# Patient Record
Sex: Male | Born: 1940 | Race: White | Hispanic: No | Smoking: Former smoker
Health system: Southern US, Community
[De-identification: ages and names within clinical notes are randomized; demographics above are authoritative.]

## PROBLEM LIST (undated history)

## (undated) DIAGNOSIS — I509 Heart failure, unspecified: Secondary | ICD-10-CM

## (undated) DIAGNOSIS — I4891 Unspecified atrial fibrillation: Secondary | ICD-10-CM

## (undated) DIAGNOSIS — R0600 Dyspnea, unspecified: Secondary | ICD-10-CM

## (undated) DIAGNOSIS — E119 Type 2 diabetes mellitus without complications: Secondary | ICD-10-CM

## (undated) DIAGNOSIS — M1990 Unspecified osteoarthritis, unspecified site: Secondary | ICD-10-CM

## (undated) DIAGNOSIS — G4730 Sleep apnea, unspecified: Secondary | ICD-10-CM

## (undated) DIAGNOSIS — K219 Gastro-esophageal reflux disease without esophagitis: Secondary | ICD-10-CM

## (undated) DIAGNOSIS — I2510 Atherosclerotic heart disease of native coronary artery without angina pectoris: Secondary | ICD-10-CM

## (undated) DIAGNOSIS — K279 Peptic ulcer, site unspecified, unspecified as acute or chronic, without hemorrhage or perforation: Secondary | ICD-10-CM

## (undated) DIAGNOSIS — I219 Acute myocardial infarction, unspecified: Secondary | ICD-10-CM

## (undated) DIAGNOSIS — I10 Essential (primary) hypertension: Secondary | ICD-10-CM

## (undated) DIAGNOSIS — E785 Hyperlipidemia, unspecified: Secondary | ICD-10-CM

## (undated) HISTORY — PX: HERNIA REPAIR: SHX51

## (undated) HISTORY — PX: COLONOSCOPY: SHX5424

## (undated) HISTORY — PX: FRACTURE SURGERY: SHX138

## (undated) HISTORY — PX: CARDIAC CATHETERIZATION: SHX172

---

## 2012-03-22 LAB — URINALYSIS, COMPLETE
Bacteria: NONE SEEN
Glucose,UR: NEGATIVE mg/dL (ref 0–75)
Nitrite: NEGATIVE
Ph: 5 (ref 4.5–8.0)
Protein: NEGATIVE
Specific Gravity: 1.014 (ref 1.003–1.030)

## 2012-03-22 LAB — COMPREHENSIVE METABOLIC PANEL
Albumin: 4.2 g/dL (ref 3.4–5.0)
Alkaline Phosphatase: 61 U/L (ref 50–136)
BUN: 21 mg/dL — ABNORMAL HIGH (ref 7–18)
Calcium, Total: 9.6 mg/dL (ref 8.5–10.1)
Co2: 25 mmol/L (ref 21–32)
Creatinine: 1.38 mg/dL — ABNORMAL HIGH (ref 0.60–1.30)
Glucose: 113 mg/dL — ABNORMAL HIGH (ref 65–99)
Osmolality: 285 (ref 275–301)
SGOT(AST): 32 U/L (ref 15–37)
Sodium: 141 mmol/L (ref 136–145)

## 2012-03-22 LAB — CBC
HCT: 42.2 % (ref 40.0–52.0)
HGB: 14 g/dL (ref 13.0–18.0)
Platelet: 190 10*3/uL (ref 150–440)
RBC: 4.72 10*6/uL (ref 4.40–5.90)
RDW: 14.8 % — ABNORMAL HIGH (ref 11.5–14.5)

## 2012-03-22 LAB — TSH: Thyroid Stimulating Horm: 0.724 u[IU]/mL

## 2012-03-22 LAB — CK TOTAL AND CKMB (NOT AT ARMC): CK-MB: 26.9 ng/mL — ABNORMAL HIGH (ref 0.5–3.6)

## 2012-03-22 LAB — LIPASE, BLOOD: Lipase: 172 U/L (ref 73–393)

## 2012-03-23 ENCOUNTER — Inpatient Hospital Stay: Payer: Self-pay | Admitting: Internal Medicine

## 2012-03-23 LAB — TROPONIN I: Troponin-I: 8.13 ng/mL — ABNORMAL HIGH

## 2012-03-23 LAB — LIPID PANEL
Cholesterol: 195 mg/dL
HDL Cholesterol: 37 mg/dL — ABNORMAL LOW
Ldl Cholesterol, Calc: 131 mg/dL — ABNORMAL HIGH
Triglycerides: 137 mg/dL
VLDL Cholesterol, Calc: 27 mg/dL

## 2012-03-23 LAB — APTT
Activated PTT: 27.9 secs (ref 23.6–35.9)
Activated PTT: 90.2 secs — ABNORMAL HIGH (ref 23.6–35.9)

## 2012-03-24 ENCOUNTER — Encounter (HOSPITAL_COMMUNITY): Payer: Self-pay | Admitting: Physician Assistant

## 2012-03-24 ENCOUNTER — Inpatient Hospital Stay (HOSPITAL_COMMUNITY)
Admission: AD | Admit: 2012-03-24 | Discharge: 2012-03-31 | DRG: 236 | Disposition: A | Discharge status: Home / Self Care | Payer: Medicare Other | Source: Other Acute Inpatient Hospital | Attending: Cardiothoracic Surgery | Admitting: Cardiothoracic Surgery

## 2012-03-24 DIAGNOSIS — K5900 Constipation, unspecified: Secondary | ICD-10-CM | POA: Diagnosis not present

## 2012-03-24 DIAGNOSIS — I4891 Unspecified atrial fibrillation: Secondary | ICD-10-CM | POA: Diagnosis not present

## 2012-03-24 DIAGNOSIS — E785 Hyperlipidemia, unspecified: Secondary | ICD-10-CM | POA: Diagnosis present

## 2012-03-24 DIAGNOSIS — I2510 Atherosclerotic heart disease of native coronary artery without angina pectoris: Secondary | ICD-10-CM

## 2012-03-24 DIAGNOSIS — I252 Old myocardial infarction: Secondary | ICD-10-CM

## 2012-03-24 DIAGNOSIS — D62 Acute posthemorrhagic anemia: Secondary | ICD-10-CM | POA: Diagnosis not present

## 2012-03-24 DIAGNOSIS — Z23 Encounter for immunization: Secondary | ICD-10-CM

## 2012-03-24 DIAGNOSIS — D696 Thrombocytopenia, unspecified: Secondary | ICD-10-CM | POA: Diagnosis not present

## 2012-03-24 DIAGNOSIS — Z951 Presence of aortocoronary bypass graft: Secondary | ICD-10-CM

## 2012-03-24 DIAGNOSIS — Z79899 Other long term (current) drug therapy: Secondary | ICD-10-CM

## 2012-03-24 DIAGNOSIS — K219 Gastro-esophageal reflux disease without esophagitis: Secondary | ICD-10-CM

## 2012-03-24 DIAGNOSIS — E8779 Other fluid overload: Secondary | ICD-10-CM | POA: Diagnosis not present

## 2012-03-24 DIAGNOSIS — Z8249 Family history of ischemic heart disease and other diseases of the circulatory system: Secondary | ICD-10-CM

## 2012-03-24 DIAGNOSIS — Z9861 Coronary angioplasty status: Secondary | ICD-10-CM

## 2012-03-24 DIAGNOSIS — I714 Abdominal aortic aneurysm, without rupture, unspecified: Secondary | ICD-10-CM

## 2012-03-24 DIAGNOSIS — Z01810 Encounter for preprocedural cardiovascular examination: Secondary | ICD-10-CM

## 2012-03-24 DIAGNOSIS — Z95820 Peripheral vascular angioplasty status with implants and grafts: Secondary | ICD-10-CM

## 2012-03-24 DIAGNOSIS — I10 Essential (primary) hypertension: Secondary | ICD-10-CM

## 2012-03-24 DIAGNOSIS — Z87891 Personal history of nicotine dependence: Secondary | ICD-10-CM

## 2012-03-24 DIAGNOSIS — I214 Non-ST elevation (NSTEMI) myocardial infarction: Secondary | ICD-10-CM

## 2012-03-24 HISTORY — DX: Acute myocardial infarction, unspecified: I21.9

## 2012-03-24 HISTORY — DX: Hyperlipidemia, unspecified: E78.5

## 2012-03-24 HISTORY — DX: Gastro-esophageal reflux disease without esophagitis: K21.9

## 2012-03-24 HISTORY — DX: Essential (primary) hypertension: I10

## 2012-03-24 LAB — TYPE AND SCREEN
ABO/RH(D): A POS
Antibody Screen: NEGATIVE

## 2012-03-24 LAB — CBC WITH DIFFERENTIAL/PLATELET
Basophil %: 0.8 %
Eosinophil #: 0.4 10*3/uL (ref 0.0–0.7)
Eosinophil %: 4.9 %
HCT: 40.3 % (ref 40.0–52.0)
MCH: 29.9 pg (ref 26.0–34.0)
MCHC: 33.2 g/dL (ref 32.0–36.0)
Neutrophil #: 4.8 10*3/uL (ref 1.4–6.5)

## 2012-03-24 LAB — COMPREHENSIVE METABOLIC PANEL
Albumin: 3.3 g/dL — ABNORMAL LOW (ref 3.4–5.0)
Alkaline Phosphatase: 50 U/L (ref 50–136)
Anion Gap: 8 (ref 7–16)
BUN: 13 mg/dL (ref 7–18)
Calcium, Total: 8.7 mg/dL (ref 8.5–10.1)
Co2: 25 mmol/L (ref 21–32)
Creatinine: 1.21 mg/dL (ref 0.60–1.30)
EGFR (African American): >60
Glucose: 103 mg/dL — ABNORMAL HIGH (ref 65–99)
Osmolality: 282 (ref 275–301)
Potassium: 4 mmol/L (ref 3.5–5.1)
SGOT(AST): 54 U/L — ABNORMAL HIGH (ref 15–37)
Total Protein: 6.7 g/dL (ref 6.4–8.2)

## 2012-03-24 LAB — ABO/RH: ABO/RH(D): A POS

## 2012-03-24 LAB — HEPARIN LEVEL (UNFRACTIONATED): Heparin Unfractionated: 0.62 IU/mL (ref 0.30–0.70)

## 2012-03-24 LAB — MAGNESIUM: Magnesium: 1.7 mg/dL — ABNORMAL LOW

## 2012-03-24 LAB — APTT: Activated PTT: 116.2 secs — ABNORMAL HIGH (ref 23.6–35.9)

## 2012-03-24 MED ORDER — ATORVASTATIN CALCIUM 40 MG PO TABS
40.0000 mg | ORAL_TABLET | Freq: Every day | ORAL | Status: DC
  Administered 2012-03-24 – 2012-03-25 (×2): 40 mg via ORAL
  Filled 2012-03-24 (×3): qty 1

## 2012-03-24 MED ORDER — TEMAZEPAM 15 MG PO CAPS: 15.0000 mg | ORAL_CAPSULE | Freq: Once | ORAL | Status: AC | PRN

## 2012-03-24 MED ORDER — SODIUM CHLORIDE 0.45 % IV SOLN: INTRAVENOUS | Status: DC

## 2012-03-24 MED ORDER — METOPROLOL TARTRATE 12.5 MG HALF TABLET
12.5000 mg | ORAL_TABLET | Freq: Two times a day (BID) | ORAL | Status: DC
  Administered 2012-03-24 – 2012-03-25 (×4): 12.5 mg via ORAL
  Filled 2012-03-24 (×6): qty 1

## 2012-03-24 MED ORDER — BISACODYL 5 MG PO TBEC: 5.0000 mg | DELAYED_RELEASE_TABLET | Freq: Once | ORAL | Status: DC

## 2012-03-24 MED ORDER — BISACODYL 5 MG PO TBEC
5.0000 mg | DELAYED_RELEASE_TABLET | Freq: Once | ORAL | Status: AC
  Administered 2012-03-24: 5 mg via ORAL
  Filled 2012-03-24: qty 1

## 2012-03-24 MED ORDER — ASPIRIN 81 MG PO CHEW
81.0000 mg | CHEWABLE_TABLET | Freq: Every day | ORAL | Status: DC
  Administered 2012-03-24 – 2012-03-25 (×2): 81 mg via ORAL
  Filled 2012-03-24: qty 1

## 2012-03-24 MED ORDER — MORPHINE SULFATE 2 MG/ML IJ SOLN
2.0000 mg | INTRAMUSCULAR | Status: DC | PRN
  Administered 2012-03-24: 2 mg via INTRAVENOUS
  Filled 2012-03-24: qty 1

## 2012-03-24 MED ORDER — CHLORHEXIDINE GLUCONATE 4 % EX LIQD
60.0000 mL | Freq: Once | CUTANEOUS | Status: AC
  Administered 2012-03-25: 4 via TOPICAL
  Filled 2012-03-24: qty 60

## 2012-03-24 MED ORDER — HEPARIN (PORCINE) IN NACL 100-0.45 UNIT/ML-% IJ SOLN
1340.0000 [IU]/h | INTRAMUSCULAR | Status: DC
  Administered 2012-03-24 – 2012-03-25 (×2): 1350 [IU]/h via INTRAVENOUS
  Filled 2012-03-24 (×4): qty 250

## 2012-03-24 MED ORDER — METOPROLOL TARTRATE 12.5 MG HALF TABLET
12.5000 mg | ORAL_TABLET | Freq: Once | ORAL | Status: AC
  Administered 2012-03-26: 12.5 mg via ORAL
  Filled 2012-03-24 (×2): qty 1

## 2012-03-24 MED ORDER — CHLORHEXIDINE GLUCONATE 4 % EX LIQD
60.0000 mL | Freq: Once | CUTANEOUS | Status: AC
  Administered 2012-03-26: 4 via TOPICAL
  Filled 2012-03-24: qty 60

## 2012-03-24 NOTE — Progress Notes (Signed)
Pharmacy: Heparin  Heparin level of 0.62 is therapeutic (goal 0.3-0.7).  Plan: 1) Continue heparin at 1340 units/hr 2) Follow up heparin level and CBC in AM

## 2012-03-24 NOTE — Consult Note (Signed)
ANTICOAGULATION CONSULT NOTE - Initial Consult  Pharmacy Consult for Heparin Indication: multivessel CAD  No Known Allergies  Patient Measurements: Height: 5\' 10"  (177.8 cm) Weight: 199 lb 1.2 oz (90.3 kg) IBW/kg (Calculated) : 73   Vital Signs: Temp: 97.7 F (36.5 C) (06/22 0934) Temp src: Oral (06/22 0934) BP: 148/76 mmHg (06/22 0934) Pulse Rate: 75  (06/22 0934)  Labs: No results found for this basename: HGB:2,HCT:3,PLT:3,APTT:3,LABPROT:3,INR:3,HEPARINUNFRC:3,CREATININE:3,CKTOTAL:3,CKMB:3,TROPONINI:3 in the last 72 hours  CrCl is unknown because no creatinine reading has been taken.   Medical History: Past Medical History  Diagnosis Date  . GERD (gastroesophageal reflux disease)   . Hyperlipidemia   . Hypertension   . Myocardial infarction    Medications:  Heparin @ 1340 units/hr  Assessment: 70yom transferred from [place] to [facility] after cath revealed multivessel CAD. He is awaiting CABG on Monday. Heparin drip was started at [place] yesterday around 1100 at 1340 units/hr (~15units/kg/hr). Cannot find documentation that a bolus was given. 6 hour APTT was ordered and came back a little high at 90.2 (goal 50-85 corresponding to heparin level 0.3-0.7). Rate was not adjusted. Heparin is currently running at 1340 units/hr. Baseline labs from [place]: sCr 1.2 and plts 190.  Goal of Therapy:  Heparin level 0.3-0.7 units/ml Monitor platelets by anticoagulation protocol: Yes   Plan:  1) Get a STAT heparin level and adjust rate if necessary 2) Daily heparin level and CBC  Fredrik Rigger 03/24/2012,1:51 PM

## 2012-03-24 NOTE — H&P (Signed)
Jon Mcgrath is an 71 y.o. male.   Chief Complaint: NSTEMI/CAD HPI:   Jon Mcgrath is a 71 yo male with known CAD S/P PCI with stent placement to his LAD peformed at [facility] in 1997.  The patient presented to [facility] on 03/23/2012 with a new complaint of chest pain.  The patient reported his chest pain had started around 8:00am, which woke him up from sleep.  The pain was continuous and felt more like pressure with radiation into his back.  The pain was associated with shortness of breath.  The patient denied any nausea, vomiting or diaphoresis.  The patient stated this pain was similar to the pain he experienced in 1997.  EKG obtained revealed Q waves in lead II, III, and aVf.  His cardiac enzymes revealed an elevated Troponin of 2.08.  The patient also admitted to a new diagnosis of an abdominal aneurysm which CT scan was obtained to rule out acute dissection, but ct report  Says no evidence of aneurysm.  The patient was admitted and Cardiology consult was obtained.  The patient was taken to the catheterization lab and found to have multivessel CAD.  It was felt the patient would be a candidate for Coronary bypass.  Dr. Tyrone Sage was contacted and has accepted the patient in transfer from Dr. Lady Gary.  Upon arrival the patient is chest pain free.  He does admit to some dyspnea with ambulation.  The patient has not been taking ASA or Plavix on an outpatient basis.  He also quit taking his cholesterol medication because he did not feel he needed it.  His family history for CAD is positive with his father dying of CAD at the age of 42.  He does not smoke currently, the patient quit approximately 6 years ago.   Past Medical History  Diagnosis Date  . GERD (gastroesophageal reflux disease)   . Hyperlipidemia   . Hypertension   . Myocardial infarction     Past Surgical History  Procedure Date  . Hernia repair     Family History  Problem Relation Age of Onset  . Heart disease Father     Social History:  reports that he quit smoking about 6 years ago. He does not have any smokeless tobacco history on file. He reports that he does not use illicit drugs. His alcohol history not on file.  Allergies: No Known Allergies  No prescriptions prior to admission    No results found for this or any previous visit (from the past 48 hour(s)). No results found.  Review of Systems  Constitutional: Negative for fever, chills and diaphoresis.  Eyes: Negative for blurred vision and double vision.  Respiratory: Positive for shortness of breath. Negative for wheezing.   Cardiovascular: Negative for chest pain, palpitations and leg swelling.  Gastrointestinal: Positive for heartburn. Negative for nausea, vomiting, abdominal pain and diarrhea.  Genitourinary: Negative.   Musculoskeletal: Negative.   Skin: Negative for rash.  Neurological: Negative for dizziness, focal weakness, seizures and headaches.  Endo/Heme/Allergies: Negative.   Psychiatric/Behavioral: Negative.     Blood pressure 148/76, pulse 75, temperature 97.7 F (36.5 C), temperature source Oral, resp. rate 16, height 5\' 10"  (1.778 m), weight 199 lb 1.2 oz (90.3 kg), SpO2 98.00%. Physical Exam  Constitutional: He is oriented to person, place, and time. Vital signs are normal. He is cooperative.  HENT:  Head: Normocephalic.  Eyes: Pupils are equal, round, and reactive to light.  Neck: No JVD present. Carotid bruit is not present.  Cardiovascular: Regular rhythm.   Respiratory: Effort normal and breath sounds normal.  GI: Soft. Bowel sounds are normal. There is no tenderness.  Neurological: He is alert and oriented to person, place, and time.  Skin: Skin is warm, dry and intact. No rash noted.    Cardiac Cath: reviewed with 40 % lm, 80 % lad, om1 80% om3 95 %, anomalous take off of rca, 100 5 small, EF at cath 55-60 %  Assessment/Plan  1. S/P NSTEMI- transfer from [place] will admit to inpatient, remain on Heparin  drip, Morphine prn chest pain 2. CAD- will need CABG, will workup for Monday 3. HTN- will continue Coreg 3.25mg  BID 4. Hyperlipidemia- will continue Lipitor 5. GERD- continue Protonix 6. Dispo will obtain preoperative workup, tentatively plan for Monday  With the patient's new onset of anginal symptoms, recent myocardial infarction and three-vessel coronary artery disease I agree with Dr. Lady Gary that proceeding with coronary bypass grafting is in the patient's best interest for release of symptoms and preservation of myocardial function. I've discussed this recommendation with the patient and his family including the risks and options involved. Tentatively plan for surgery Monday morning. CABGThe goals risks and alternatives of the planned surgical procedure CABG have been discussed with the patient in detail. The risks of the procedure including death, infection, stroke, myocardial infarction, bleeding, blood transfusion have all been discussed specifically.  I have quoted Jon Mcgrath a 4 % of perioperative mortality and a complication rate as high as 15  %. The patient's questions have been answered.Jon Mcgrath is willing  to proceed with the planned procedure.

## 2012-03-24 NOTE — Progress Notes (Addendum)
VASCULAR LAB PRELIMINARY  PRELIMINARY  PRELIMINARY  PRELIMINARY  Pre-op Cardiac Surgery  Carotid Findings:  No evidence of significant ICA stenosis and vertebral artery flow is antegrade bilaterally.    Upper Extremity Right Left  Brachial Pressures 142 Triphasic 127 Biphasic  Radial Waveforms Triphasic Triphasic  Ulnar Waveforms Biphasic Triphasic  Palmar Arch (Allen's Test) Normal Normal   Findings:  Palmar arch evaluation - Doppler waveforms remained normal bilaterally with both radial and ulnar compressions.    Lower  Extremity Right Left  Dorsalis Pedis    Anterior Tibial    Posterior Tibial    Ankle/Brachial Indices      Findings:  Palpable pedal pulses x 4.     BIGGS, SANDRA, RVT 03/24/2012, 2:05 PM  Tasman Zapata, [place] D, RVS 03/24/2012, 2:35 PM

## 2012-03-25 ENCOUNTER — Encounter (HOSPITAL_COMMUNITY): Payer: Self-pay | Admitting: Anesthesiology

## 2012-03-25 DIAGNOSIS — I369 Nonrheumatic tricuspid valve disorder, unspecified: Secondary | ICD-10-CM

## 2012-03-25 LAB — BLOOD GAS, ARTERIAL
Acid-base deficit: 1.4 mmol/L (ref 0.0–2.0)
Bicarbonate: 22.4 mEq/L (ref 20.0–24.0)
Drawn by: 21338
FIO2: 0.21 %
O2 Saturation: 94.4 %
Patient temperature: 98.6
TCO2: 23.5 mmol/L (ref 0–100)
pCO2 arterial: 35.2 mmHg (ref 35.0–45.0)
pH, Arterial: 7.42 (ref 7.350–7.450)
pO2, Arterial: 70 mmHg — ABNORMAL LOW (ref 80.0–100.0)

## 2012-03-25 LAB — CBC
HCT: 37.2 % — ABNORMAL LOW (ref 39.0–52.0)
Hemoglobin: 12.6 g/dL — ABNORMAL LOW (ref 13.0–17.0)
MCV: 87.9 fL (ref 78.0–100.0)
RDW: 14.4 % (ref 11.5–15.5)
WBC: 8.4 10*3/uL (ref 4.0–10.5)

## 2012-03-25 LAB — SURGICAL PCR SCREEN
MRSA, PCR: NEGATIVE
Staphylococcus aureus: NEGATIVE

## 2012-03-25 LAB — BASIC METABOLIC PANEL
BUN: 13 mg/dL (ref 6–23)
Chloride: 108 mEq/L (ref 96–112)
Creatinine, Ser: 1.03 mg/dL (ref 0.50–1.35)
GFR calc Af Amer: 84 mL/min — ABNORMAL LOW (ref >90)
Glucose, Bld: 108 mg/dL — ABNORMAL HIGH (ref 70–99)
Potassium: 3.7 mEq/L (ref 3.5–5.1)

## 2012-03-25 LAB — HEPARIN LEVEL (UNFRACTIONATED): Heparin Unfractionated: 0.57 IU/mL (ref 0.30–0.70)

## 2012-03-25 MED ORDER — INSULIN REGULAR HUMAN 100 UNIT/ML IJ SOLN
INTRAMUSCULAR | Status: AC
  Administered 2012-03-26: 2.1 [IU]/h via INTRAVENOUS
  Filled 2012-03-25: qty 1

## 2012-03-25 MED ORDER — NITROGLYCERIN IN D5W 200-5 MCG/ML-% IV SOLN
2.0000 ug/min | INTRAVENOUS | Status: AC
  Administered 2012-03-26: 16.66 ug/min via INTRAVENOUS
  Administered 2012-03-26: 5 ug/min via INTRAVENOUS
  Filled 2012-03-25: qty 250

## 2012-03-25 MED ORDER — TRANEXAMIC ACID 100 MG/ML IV SOLN
1.5000 mg/kg/h | INTRAVENOUS | Status: AC
  Administered 2012-03-26: 1.5 mg/kg/h via INTRAVENOUS
  Filled 2012-03-25: qty 25

## 2012-03-25 MED ORDER — EPINEPHRINE HCL 1 MG/ML IJ SOLN
0.5000 ug/min | INTRAVENOUS | Status: DC
  Filled 2012-03-25: qty 4

## 2012-03-25 MED ORDER — SODIUM BICARBONATE 8.4 % IV SOLN
INTRAVENOUS | Status: AC
  Administered 2012-03-26: 10:00:00
  Filled 2012-03-25: qty 2.5

## 2012-03-25 MED ORDER — POTASSIUM CHLORIDE 2 MEQ/ML IV SOLN
80.0000 meq | INTRAVENOUS | Status: DC
  Filled 2012-03-25: qty 40

## 2012-03-25 MED ORDER — DOPAMINE-DEXTROSE 3.2-5 MG/ML-% IV SOLN
2.0000 ug/kg/min | INTRAVENOUS | Status: DC
  Filled 2012-03-25: qty 250

## 2012-03-25 MED ORDER — DEXMEDETOMIDINE HCL 100 MCG/ML IV SOLN
0.1000 ug/kg/h | INTRAVENOUS | Status: AC
  Administered 2012-03-26: .2 ug/kg/h via INTRAVENOUS
  Filled 2012-03-25: qty 4

## 2012-03-25 MED ORDER — TEMAZEPAM 15 MG PO CAPS
15.0000 mg | ORAL_CAPSULE | Freq: Once | ORAL | Status: AC | PRN
  Administered 2012-03-25: 15 mg via ORAL
  Filled 2012-03-25: qty 1

## 2012-03-25 MED ORDER — TRANEXAMIC ACID (OHS) BOLUS VIA INFUSION
15.0000 mg/kg | INTRAVENOUS | Status: AC
  Administered 2012-03-26: 1350 mg via INTRAVENOUS
  Filled 2012-03-25: qty 1350

## 2012-03-25 MED ORDER — VANCOMYCIN HCL 1000 MG IV SOLR
1500.0000 mg | INTRAVENOUS | Status: AC
  Administered 2012-03-26: 1500 mg via INTRAVENOUS
  Filled 2012-03-25 (×2): qty 1500

## 2012-03-25 MED ORDER — MAGNESIUM SULFATE 50 % IJ SOLN
40.0000 meq | INTRAMUSCULAR | Status: DC
  Filled 2012-03-25: qty 10

## 2012-03-25 MED ORDER — TRANEXAMIC ACID (OHS) PUMP PRIME SOLUTION
2.0000 mg/kg | INTRAVENOUS | Status: DC
  Filled 2012-03-25: qty 1.8

## 2012-03-25 MED ORDER — PHENYLEPHRINE HCL 10 MG/ML IJ SOLN
30.0000 ug/min | INTRAVENOUS | Status: DC
  Filled 2012-03-25: qty 2

## 2012-03-25 MED ORDER — PANTOPRAZOLE SODIUM 20 MG PO TBEC
20.0000 mg | DELAYED_RELEASE_TABLET | Freq: Every day | ORAL | Status: DC
  Administered 2012-03-25: 20 mg via ORAL
  Filled 2012-03-25 (×2): qty 1

## 2012-03-25 MED ORDER — DEXTROSE 5 % IV SOLN
1.5000 g | INTRAVENOUS | Status: AC
  Administered 2012-03-26: 1.5 g via INTRAVENOUS
  Administered 2012-03-26: .75 g via INTRAVENOUS
  Filled 2012-03-25: qty 1.5

## 2012-03-25 MED ORDER — DEXTROSE 5 % IV SOLN
750.0000 mg | INTRAVENOUS | Status: DC
  Filled 2012-03-25: qty 750

## 2012-03-25 NOTE — Progress Notes (Signed)
ANTICOAGULATION CONSULT NOTE - Follow Up Consult  Pharmacy Consult for Heparin Indication: multivessel CAD  No Known Allergies  Vital Signs: Temp: 97.7 F (36.5 C) (06/23 0601) Temp src: Oral (06/23 0601) BP: 147/76 mmHg (06/23 0601) Pulse Rate: 59  (06/23 0601)  Labs:  Basename 03/25/12 0545 03/24/12 1447  HGB 12.6* --  HCT 37.2* --  PLT 159 --  APTT -- --  LABPROT -- --  INR -- --  HEPARINUNFRC 0.57 0.62  CREATININE 1.03 --  CKTOTAL -- --  CKMB -- --  TROPONINI -- --    Estimated Creatinine Clearance: 76.4 ml/min (by C-G formula based on Cr of 1.03).   Medications:  Heparin @ 1350 units/hr  Assessment: 70yom continues on heparin with a therapeutic heparin level awaiting CABG tomorrow. No bleeding noted.  CBC stable.  Goal of Therapy:  Heparin level 0.3-0.7 units/ml Monitor platelets by anticoagulation protocol: Yes   Plan:  1) Continue heparin at 1350 units/hr 2) Follow up after CABG 6/24  Fredrik Rigger 03/25/2012,10:51 AM

## 2012-03-25 NOTE — Progress Notes (Signed)
  Echocardiogram 2D Echocardiogram has been performed.  Jon Mcgrath 03/25/2012, 3:40 PM

## 2012-03-25 NOTE — Progress Notes (Addendum)
Procedure(s) (LRB): CORONARY ARTERY BYPASS GRAFTING (CABG) (N/A) ENDOVEIN HARVEST OF GREATER SAPHENOUS VEIN (N/A)  Subjective: Jon Mcgrath denies chest pain overnight.  No dyspnea  Objective: Vital signs in last 24 hours: Temp:  [97.4 F (36.3 C)-97.7 F (36.5 C)] 97.7 F (36.5 C) (06/23 0601) Pulse Rate:  [59-76] 59  (06/23 0601) Cardiac Rhythm:  [-] Normal sinus rhythm (06/23 0837) Resp:  [16-18] 18  (06/23 0601) BP: (132-148)/(72-86) 147/76 mmHg (06/23 0601) SpO2:  [96 %-98 %] 96 % (06/23 0601) Weight:  [198 lb 8 oz (90.039 kg)-199 lb 1.2 oz (90.3 kg)] 198 lb 8 oz (90.039 kg) (06/23 0601)  Intake/Output from previous day: 06/22 0701 - 06/23 0700 In: 533.1 [P.O.:240; I.V.:293.1] Out: -   General appearance: alert, cooperative and no distress Heart: regular rate and rhythm Lungs: clear to auscultation bilaterally Abdomen: soft, non-tender; bowel sounds normal; no masses,  no organomegaly Wound: clean and dry  Lab Results:  Basename 03/25/12 0545  WBC 8.4  HGB 12.6*  HCT 37.2*  PLT 159   BMET:  Basename 03/25/12 0545  NA 141  K 3.7  CL 108  CO2 24  GLUCOSE 108*  BUN 13  CREATININE 1.03  CALCIUM 8.9    PT/INR: No results found for this basename: LABPROT,INR in the last 72 hours ABG No results found for this basename: phart, pco2, po2, hco3, tco2, acidbasedef, o2sat   CBG (last 3)  No results found for this basename: GLUCAP:3 in the last 72 hours  Assessment/Plan: S/P Procedure(s) (LRB): CORONARY ARTERY BYPASS GRAFTING (CABG) (N/A) ENDOVEIN HARVEST OF GREATER SAPHENOUS VEIN (N/A)  1. S/P NSTEMI 2. CAD- will need coronary bypass 3. HTN- continue Lopressor 4. Hyperlipidemia- continue Lipitor 5. GERD- on protonix 6. Dispo- patient for CABG tomorrow, NPO at midnight, prep as ordered   LOS: 1 day    Raford Pitcher, ERIN 03/25/2012  Patient stable today, he is agreeable to proceed tomorrow, cr stable I have seen and examined Jon Mcgrath and agree with the  above assessment  and plan.  Delight Ovens MD [facility] [phone] Office [phone] 03/25/2012 12:02 PM

## 2012-03-26 ENCOUNTER — Encounter (HOSPITAL_COMMUNITY): Payer: Self-pay | Admitting: Anesthesiology

## 2012-03-26 ENCOUNTER — Encounter (HOSPITAL_COMMUNITY)
Admission: AD | Disposition: A | Discharge status: Home / Self Care | Payer: Self-pay | Source: Other Acute Inpatient Hospital | Attending: Cardiothoracic Surgery

## 2012-03-26 ENCOUNTER — Inpatient Hospital Stay (HOSPITAL_COMMUNITY): Payer: Medicare Other

## 2012-03-26 ENCOUNTER — Inpatient Hospital Stay (HOSPITAL_COMMUNITY): Payer: Medicare Other | Admitting: Anesthesiology

## 2012-03-26 HISTORY — PX: CORONARY ARTERY BYPASS GRAFT: SHX141

## 2012-03-26 HISTORY — PX: ENDOVEIN HARVEST OF GREATER SAPHENOUS VEIN: SHX5059

## 2012-03-26 LAB — POCT I-STAT 3, ART BLOOD GAS (G3+)
Acid-base deficit: 1 mmol/L (ref 0.0–2.0)
Acid-base deficit: 1 mmol/L (ref 0.0–2.0)
Acid-base deficit: 2 mmol/L (ref 0.0–2.0)
Acid-base deficit: 5 mmol/L — ABNORMAL HIGH (ref 0.0–2.0)
Bicarbonate: 25 mEq/L — ABNORMAL HIGH (ref 20.0–24.0)
Bicarbonate: 23.6 mEq/L (ref 20.0–24.0)
Bicarbonate: 22.9 mEq/L (ref 20.0–24.0)
Bicarbonate: 20.1 mEq/L (ref 20.0–24.0)
O2 Saturation: 100 %
O2 Saturation: 100 %
O2 Saturation: 93 %
O2 Saturation: 97 %
Patient temperature: 32.6
Patient temperature: 36.9
Patient temperature: 36
Patient temperature: 98.6
TCO2: 26 mmol/L (ref 0–100)
TCO2: 25 mmol/L (ref 0–100)
TCO2: 24 mmol/L (ref 0–100)
TCO2: 21 mmol/L (ref 0–100)
pCO2 arterial: 39.7 mmHg (ref 35.0–45.0)
pCO2 arterial: 38 mmHg (ref 35.0–45.0)
pCO2 arterial: 36.3 mmHg (ref 35.0–45.0)
pCO2 arterial: 34.2 mmHg — ABNORMAL LOW (ref 35.0–45.0)
pH, Arterial: 7.385 (ref 7.350–7.450)
pH, Arterial: 7.401 (ref 7.350–7.450)
pH, Arterial: 7.402 (ref 7.350–7.450)
pH, Arterial: 7.376 (ref 7.350–7.450)
pO2, Arterial: 294 mmHg — ABNORMAL HIGH (ref 80.0–100.0)
pO2, Arterial: 168 mmHg — ABNORMAL HIGH (ref 80.0–100.0)
pO2, Arterial: 62 mmHg — ABNORMAL LOW (ref 80.0–100.0)
pO2, Arterial: 89 mmHg (ref 80.0–100.0)

## 2012-03-26 LAB — CBC
HCT: 32.5 % — ABNORMAL LOW (ref 39.0–52.0)
Hemoglobin: 10.2 g/dL — ABNORMAL LOW (ref 13.0–17.0)
Hemoglobin: 10.9 g/dL — ABNORMAL LOW (ref 13.0–17.0)
MCH: 29.8 pg (ref 26.0–34.0)
MCHC: 33.5 g/dL (ref 30.0–36.0)
MCV: 88.8 fL (ref 78.0–100.0)
Platelets: 110 10*3/uL — ABNORMAL LOW (ref 150–400)
RBC: 3.5 MIL/uL — ABNORMAL LOW (ref 4.22–5.81)
RBC: 3.66 MIL/uL — ABNORMAL LOW (ref 4.22–5.81)
RDW: 14.7 % (ref 11.5–15.5)
WBC: 10.8 10*3/uL — ABNORMAL HIGH (ref 4.0–10.5)
WBC: 12.7 10*3/uL — ABNORMAL HIGH (ref 4.0–10.5)

## 2012-03-26 LAB — POCT I-STAT 4, (NA,K, GLUC, HGB,HCT)
Glucose, Bld: 131 mg/dL — ABNORMAL HIGH (ref 70–99)
Glucose, Bld: 104 mg/dL — ABNORMAL HIGH (ref 70–99)
Glucose, Bld: 94 mg/dL (ref 70–99)
Glucose, Bld: 130 mg/dL — ABNORMAL HIGH (ref 70–99)
Glucose, Bld: 141 mg/dL — ABNORMAL HIGH (ref 70–99)
Glucose, Bld: 134 mg/dL — ABNORMAL HIGH (ref 70–99)
Glucose, Bld: 99 mg/dL (ref 70–99)
HCT: 37 % — ABNORMAL LOW (ref 39.0–52.0)
HCT: 32 % — ABNORMAL LOW (ref 39.0–52.0)
HCT: 26 % — ABNORMAL LOW (ref 39.0–52.0)
HCT: 24 % — ABNORMAL LOW (ref 39.0–52.0)
HCT: 24 % — ABNORMAL LOW (ref 39.0–52.0)
HCT: 25 % — ABNORMAL LOW (ref 39.0–52.0)
HCT: 30 % — ABNORMAL LOW (ref 39.0–52.0)
Hemoglobin: 12.6 g/dL — ABNORMAL LOW (ref 13.0–17.0)
Hemoglobin: 10.9 g/dL — ABNORMAL LOW (ref 13.0–17.0)
Hemoglobin: 8.8 g/dL — ABNORMAL LOW (ref 13.0–17.0)
Hemoglobin: 8.2 g/dL — ABNORMAL LOW (ref 13.0–17.0)
Hemoglobin: 8.2 g/dL — ABNORMAL LOW (ref 13.0–17.0)
Hemoglobin: 8.5 g/dL — ABNORMAL LOW (ref 13.0–17.0)
Hemoglobin: 10.2 g/dL — ABNORMAL LOW (ref 13.0–17.0)
Potassium: 3.7 mEq/L (ref 3.5–5.1)
Potassium: 3.9 mEq/L (ref 3.5–5.1)
Potassium: 4.5 mEq/L (ref 3.5–5.1)
Potassium: 4.5 mEq/L (ref 3.5–5.1)
Potassium: 4.9 mEq/L (ref 3.5–5.1)
Potassium: 3.9 mEq/L (ref 3.5–5.1)
Potassium: 3.6 mEq/L (ref 3.5–5.1)
Sodium: 143 mEq/L (ref 135–145)
Sodium: 142 mEq/L (ref 135–145)
Sodium: 139 mEq/L (ref 135–145)
Sodium: 136 mEq/L (ref 135–145)
Sodium: 137 mEq/L (ref 135–145)
Sodium: 141 mEq/L (ref 135–145)
Sodium: 141 mEq/L (ref 135–145)

## 2012-03-26 LAB — POCT I-STAT GLUCOSE
Glucose, Bld: 114 mg/dL — ABNORMAL HIGH (ref 70–99)
Operator id: 3390

## 2012-03-26 LAB — CREATININE, SERUM
Creatinine, Ser: 0.9 mg/dL (ref 0.50–1.35)
GFR calc Af Amer: >90 mL/min (ref >90)
GFR calc non Af Amer: 85 mL/min — ABNORMAL LOW (ref >90)

## 2012-03-26 LAB — PROTIME-INR
INR: 1.31 (ref 0.00–1.49)
Prothrombin Time: 16.5 seconds — ABNORMAL HIGH (ref 11.6–15.2)

## 2012-03-26 LAB — GLUCOSE, CAPILLARY
Glucose-Capillary: 92 mg/dL (ref 70–99)
Glucose-Capillary: 99 mg/dL (ref 70–99)
Glucose-Capillary: 103 mg/dL — ABNORMAL HIGH (ref 70–99)
Glucose-Capillary: 100 mg/dL — ABNORMAL HIGH (ref 70–99)
Glucose-Capillary: 106 mg/dL — ABNORMAL HIGH (ref 70–99)

## 2012-03-26 LAB — MAGNESIUM: Magnesium: 2.7 mg/dL — ABNORMAL HIGH (ref 1.5–2.5)

## 2012-03-26 LAB — POCT I-STAT 3, VENOUS BLOOD GAS (G3P V)
Acid-base deficit: 3 mmol/L — ABNORMAL HIGH (ref 0.0–2.0)
Bicarbonate: 23.5 mEq/L (ref 20.0–24.0)
O2 Saturation: 83 %
Patient temperature: 32.6
TCO2: 25 mmol/L (ref 0–100)
pCO2, Ven: 40.4 mmHg — ABNORMAL LOW (ref 45.0–50.0)
pH, Ven: 7.351 — ABNORMAL HIGH (ref 7.250–7.300)
pO2, Ven: 39 mmHg (ref 30.0–45.0)

## 2012-03-26 LAB — HEMOGLOBIN AND HEMATOCRIT, BLOOD
HCT: 23.7 % — ABNORMAL LOW (ref 39.0–52.0)
Hemoglobin: 8.1 g/dL — ABNORMAL LOW (ref 13.0–17.0)

## 2012-03-26 LAB — APTT: aPTT: 28 seconds (ref 24–37)

## 2012-03-26 LAB — PLATELET COUNT: Platelets: 108 10*3/uL — ABNORMAL LOW (ref 150–400)

## 2012-03-26 SURGERY — CORONARY ARTERY BYPASS GRAFTING (CABG)
Anesthesia: General | Site: Chest | Wound class: Clean

## 2012-03-26 MED ORDER — SODIUM CHLORIDE 0.9 % IV SOLN
INTRAVENOUS | Status: DC
  Filled 2012-03-26: qty 1

## 2012-03-26 MED ORDER — INSULIN ASPART 100 UNIT/ML ~~LOC~~ SOLN: 0.0000 [IU] | SUBCUTANEOUS | Status: DC

## 2012-03-26 MED ORDER — HEPARIN SODIUM (PORCINE) 1000 UNIT/ML IJ SOLN
INTRAMUSCULAR | Status: DC | PRN
  Administered 2012-03-26: 35000 [IU] via INTRAVENOUS

## 2012-03-26 MED ORDER — LACTATED RINGERS IV SOLN
INTRAVENOUS | Status: DC | PRN
  Administered 2012-03-26: 07:00:00 via INTRAVENOUS

## 2012-03-26 MED ORDER — ASPIRIN EC 325 MG PO TBEC
325.0000 mg | DELAYED_RELEASE_TABLET | Freq: Every day | ORAL | Status: DC
  Administered 2012-03-27 – 2012-03-28 (×2): 325 mg via ORAL
  Filled 2012-03-26 (×2): qty 1

## 2012-03-26 MED ORDER — METOPROLOL TARTRATE 1 MG/ML IV SOLN
2.5000 mg | INTRAVENOUS | Status: DC | PRN
  Filled 2012-03-26: qty 5

## 2012-03-26 MED ORDER — ROCURONIUM BROMIDE 100 MG/10ML IV SOLN
INTRAVENOUS | Status: DC | PRN
  Administered 2012-03-26: 20 mg via INTRAVENOUS
  Administered 2012-03-26: 100 mg via INTRAVENOUS
  Administered 2012-03-26: 30 mg via INTRAVENOUS
  Administered 2012-03-26: 20 mg via INTRAVENOUS
  Administered 2012-03-26: 30 mg via INTRAVENOUS
  Administered 2012-03-26: 50 mg via INTRAVENOUS

## 2012-03-26 MED ORDER — ACETAMINOPHEN 650 MG RE SUPP
650.0000 mg | RECTAL | Status: AC
  Administered 2012-03-26: 650 mg via RECTAL

## 2012-03-26 MED ORDER — ONDANSETRON HCL 4 MG/2ML IJ SOLN
4.0000 mg | Freq: Four times a day (QID) | INTRAMUSCULAR | Status: DC | PRN
  Administered 2012-03-27: 4 mg via INTRAVENOUS
  Filled 2012-03-26: qty 2

## 2012-03-26 MED ORDER — BISACODYL 5 MG PO TBEC
10.0000 mg | DELAYED_RELEASE_TABLET | Freq: Every day | ORAL | Status: DC
  Administered 2012-03-27: 10 mg via ORAL
  Filled 2012-03-26: qty 2

## 2012-03-26 MED ORDER — MIDAZOLAM HCL 2 MG/2ML IJ SOLN: 2.0000 mg | INTRAMUSCULAR | Status: DC | PRN

## 2012-03-26 MED ORDER — 0.9 % SODIUM CHLORIDE (POUR BTL) OPTIME
TOPICAL | Status: DC | PRN
  Administered 2012-03-26: 5000 mL

## 2012-03-26 MED ORDER — MORPHINE SULFATE 2 MG/ML IJ SOLN
2.0000 mg | INTRAMUSCULAR | Status: DC | PRN
  Administered 2012-03-27: 2 mg via INTRAVENOUS
  Administered 2012-03-27 – 2012-03-28 (×2): 4 mg via INTRAVENOUS
  Filled 2012-03-26: qty 1
  Filled 2012-03-26 (×2): qty 2

## 2012-03-26 MED ORDER — MAGNESIUM SULFATE 40 MG/ML IJ SOLN
4.0000 g | Freq: Once | INTRAMUSCULAR | Status: AC
  Administered 2012-03-26: 4 g via INTRAVENOUS
  Filled 2012-03-26: qty 100

## 2012-03-26 MED ORDER — PHENYLEPHRINE HCL 10 MG/ML IJ SOLN
10.0000 mg | INTRAMUSCULAR | Status: DC | PRN
  Administered 2012-03-26: 10 ug/min via INTRAVENOUS

## 2012-03-26 MED ORDER — SODIUM CHLORIDE 0.9 % IJ SOLN
OROMUCOSAL | Status: DC | PRN
  Administered 2012-03-26: 09:00:00 via TOPICAL

## 2012-03-26 MED ORDER — ALBUMIN HUMAN 5 % IV SOLN
INTRAVENOUS | Status: DC | PRN
  Administered 2012-03-26: 14:00:00 via INTRAVENOUS

## 2012-03-26 MED ORDER — SODIUM CHLORIDE 0.9 % IJ SOLN: 3.0000 mL | INTRAMUSCULAR | Status: DC | PRN

## 2012-03-26 MED ORDER — INSULIN ASPART 100 UNIT/ML ~~LOC~~ SOLN
0.0000 [IU] | SUBCUTANEOUS | Status: DC
  Administered 2012-03-27 (×2): 2 [IU] via SUBCUTANEOUS

## 2012-03-26 MED ORDER — PROPOFOL 10 MG/ML IV EMUL
INTRAVENOUS | Status: DC | PRN
  Administered 2012-03-26: 40 mg via INTRAVENOUS
  Administered 2012-03-26: 80 mg via INTRAVENOUS

## 2012-03-26 MED ORDER — ACETAMINOPHEN 160 MG/5ML PO SOLN
975.0000 mg | Freq: Four times a day (QID) | ORAL | Status: DC
  Filled 2012-03-26: qty 40.6

## 2012-03-26 MED ORDER — FAMOTIDINE IN NACL 20-0.9 MG/50ML-% IV SOLN
20.0000 mg | Freq: Two times a day (BID) | INTRAVENOUS | Status: DC
  Administered 2012-03-26: 20 mg via INTRAVENOUS

## 2012-03-26 MED ORDER — MORPHINE SULFATE 2 MG/ML IJ SOLN: 1.0000 mg | INTRAMUSCULAR | Status: AC | PRN

## 2012-03-26 MED ORDER — METOPROLOL TARTRATE 12.5 MG HALF TABLET
12.5000 mg | ORAL_TABLET | Freq: Two times a day (BID) | ORAL | Status: DC
  Administered 2012-03-27: 12.5 mg via ORAL
  Filled 2012-03-26 (×5): qty 1

## 2012-03-26 MED ORDER — FENTANYL CITRATE 0.05 MG/ML IJ SOLN
INTRAMUSCULAR | Status: DC | PRN
  Administered 2012-03-26: 50 ug via INTRAVENOUS
  Administered 2012-03-26: 250 ug via INTRAVENOUS
  Administered 2012-03-26: 150 ug via INTRAVENOUS
  Administered 2012-03-26: 250 ug via INTRAVENOUS
  Administered 2012-03-26: 150 ug via INTRAVENOUS
  Administered 2012-03-26: 200 ug via INTRAVENOUS
  Administered 2012-03-26: 100 ug via INTRAVENOUS
  Administered 2012-03-26: 250 ug via INTRAVENOUS
  Administered 2012-03-26 (×2): 100 ug via INTRAVENOUS
  Administered 2012-03-26: 150 ug via INTRAVENOUS

## 2012-03-26 MED ORDER — DEXTROSE 5 % IV SOLN
1.5000 g | Freq: Two times a day (BID) | INTRAVENOUS | Status: AC
  Administered 2012-03-26 – 2012-03-28 (×4): 1.5 g via INTRAVENOUS
  Filled 2012-03-26 (×4): qty 1.5

## 2012-03-26 MED ORDER — PHENYLEPHRINE HCL 10 MG/ML IJ SOLN
0.0000 ug/min | INTRAVENOUS | Status: DC
  Filled 2012-03-26: qty 2

## 2012-03-26 MED ORDER — PROTAMINE SULFATE 10 MG/ML IV SOLN
INTRAVENOUS | Status: DC | PRN
  Administered 2012-03-26: 270 mg via INTRAVENOUS
  Administered 2012-03-26: 10 mg via INTRAVENOUS
  Administered 2012-03-26 (×2): 25 mg via INTRAVENOUS

## 2012-03-26 MED ORDER — HEMOSTATIC AGENTS (NO CHARGE) OPTIME
TOPICAL | Status: DC | PRN
  Administered 2012-03-26: 1 via TOPICAL

## 2012-03-26 MED ORDER — LACTATED RINGERS IV SOLN
INTRAVENOUS | Status: DC | PRN
  Administered 2012-03-26 (×2): via INTRAVENOUS

## 2012-03-26 MED ORDER — LACTATED RINGERS IV SOLN: 500.0000 mL | Freq: Once | INTRAVENOUS | Status: AC | PRN

## 2012-03-26 MED ORDER — INSULIN REGULAR BOLUS VIA INFUSION
0.0000 [IU] | Freq: Three times a day (TID) | INTRAVENOUS | Status: DC
  Filled 2012-03-26: qty 10

## 2012-03-26 MED ORDER — POTASSIUM CHLORIDE 10 MEQ/50ML IV SOLN
10.0000 meq | INTRAVENOUS | Status: AC
  Administered 2012-03-26 (×3): 10 meq via INTRAVENOUS

## 2012-03-26 MED ORDER — ACETAMINOPHEN 500 MG PO TABS
1000.0000 mg | ORAL_TABLET | Freq: Four times a day (QID) | ORAL | Status: DC
  Administered 2012-03-27 – 2012-03-28 (×7): 1000 mg via ORAL
  Filled 2012-03-26 (×8): qty 2

## 2012-03-26 MED ORDER — SODIUM CHLORIDE 0.45 % IV SOLN
INTRAVENOUS | Status: DC
  Administered 2012-03-26: 15:00:00 via INTRAVENOUS

## 2012-03-26 MED ORDER — SODIUM CHLORIDE 0.9 % IV SOLN: 250.0000 mL | INTRAVENOUS | Status: DC

## 2012-03-26 MED ORDER — DEXTROSE 5 % IV SOLN
INTRAVENOUS | Status: DC | PRN
  Administered 2012-03-26 (×2): via INTRAVENOUS

## 2012-03-26 MED ORDER — ALBUMIN HUMAN 5 % IV SOLN
250.0000 mL | INTRAVENOUS | Status: AC | PRN
  Administered 2012-03-26 (×2): 250 mL via INTRAVENOUS

## 2012-03-26 MED ORDER — BISACODYL 10 MG RE SUPP: 10.0000 mg | Freq: Every day | RECTAL | Status: DC

## 2012-03-26 MED ORDER — LIDOCAINE HCL (CARDIAC) 20 MG/ML IV SOLN
INTRAVENOUS | Status: DC | PRN
  Administered 2012-03-26: 100 mg via INTRAVENOUS

## 2012-03-26 MED ORDER — LACTATED RINGERS IV SOLN: INTRAVENOUS | Status: DC

## 2012-03-26 MED ORDER — METOPROLOL TARTRATE 25 MG/10 ML ORAL SUSPENSION
12.5000 mg | Freq: Two times a day (BID) | ORAL | Status: DC
  Filled 2012-03-26 (×5): qty 5

## 2012-03-26 MED ORDER — DOCUSATE SODIUM 100 MG PO CAPS
200.0000 mg | ORAL_CAPSULE | Freq: Every day | ORAL | Status: DC
  Administered 2012-03-27: 200 mg via ORAL
  Filled 2012-03-26: qty 2

## 2012-03-26 MED ORDER — ACETAMINOPHEN 160 MG/5ML PO SOLN: 650.0000 mg | ORAL | Status: AC

## 2012-03-26 MED ORDER — MIDAZOLAM HCL 5 MG/5ML IJ SOLN
INTRAMUSCULAR | Status: DC | PRN
  Administered 2012-03-26: 3 mg via INTRAVENOUS
  Administered 2012-03-26: 2 mg via INTRAVENOUS
  Administered 2012-03-26: 1 mg via INTRAVENOUS

## 2012-03-26 MED ORDER — SODIUM CHLORIDE 0.9 % IV SOLN
0.1000 ug/kg/h | INTRAVENOUS | Status: DC
  Filled 2012-03-26: qty 2

## 2012-03-26 MED ORDER — SODIUM CHLORIDE 0.9 % IV SOLN
INTRAVENOUS | Status: DC | PRN
  Administered 2012-03-26: 08:00:00 via INTRAVENOUS

## 2012-03-26 MED ORDER — OXYCODONE HCL 5 MG PO TABS
5.0000 mg | ORAL_TABLET | ORAL | Status: DC | PRN
  Administered 2012-03-27 – 2012-03-28 (×7): 10 mg via ORAL
  Filled 2012-03-26 (×5): qty 2
  Filled 2012-03-26: qty 1
  Filled 2012-03-26 (×2): qty 2

## 2012-03-26 MED ORDER — ASPIRIN 81 MG PO CHEW: 324.0000 mg | CHEWABLE_TABLET | Freq: Every day | ORAL | Status: DC

## 2012-03-26 MED ORDER — LACTATED RINGERS IV SOLN
INTRAVENOUS | Status: DC | PRN
  Administered 2012-03-26 (×2): via INTRAVENOUS

## 2012-03-26 MED ORDER — SODIUM CHLORIDE 0.9 % IJ SOLN
3.0000 mL | Freq: Two times a day (BID) | INTRAMUSCULAR | Status: DC
  Administered 2012-03-27 – 2012-03-28 (×3): 3 mL via INTRAVENOUS

## 2012-03-26 MED ORDER — SODIUM CHLORIDE 0.9 % IV SOLN: INTRAVENOUS | Status: DC

## 2012-03-26 MED ORDER — INSULIN ASPART 100 UNIT/ML ~~LOC~~ SOLN: 0.0000 [IU] | SUBCUTANEOUS | Status: AC

## 2012-03-26 MED ORDER — VANCOMYCIN HCL IN DEXTROSE 1-5 GM/200ML-% IV SOLN
1000.0000 mg | Freq: Once | INTRAVENOUS | Status: AC
  Administered 2012-03-26: 1000 mg via INTRAVENOUS
  Filled 2012-03-26: qty 200

## 2012-03-26 MED ORDER — PANTOPRAZOLE SODIUM 40 MG PO TBEC
40.0000 mg | DELAYED_RELEASE_TABLET | Freq: Every day | ORAL | Status: DC
  Administered 2012-03-28: 40 mg via ORAL
  Filled 2012-03-26: qty 1

## 2012-03-26 MED ORDER — NITROGLYCERIN IN D5W 200-5 MCG/ML-% IV SOLN: 0.0000 ug/min | INTRAVENOUS | Status: DC

## 2012-03-26 MED FILL — Heparin Sodium (Porcine) 100 Unt/ML in Sodium Chloride 0.45%: INTRAMUSCULAR | Qty: 250 | Status: AC

## 2012-03-26 SURGICAL SUPPLY — 115 items
ATTRACTOMAT 16X20 MAGNETIC DRP (DRAPES) ×3 IMPLANT
BAG DECANTER FOR FLEXI CONT (MISCELLANEOUS) ×3 IMPLANT
BANDAGE ELASTIC 4 VELCRO ST LF (GAUZE/BANDAGES/DRESSINGS) ×3 IMPLANT
BANDAGE ELASTIC 6 VELCRO ST LF (GAUZE/BANDAGES/DRESSINGS) ×3 IMPLANT
BANDAGE GAUZE ELAST BULKY 4 IN (GAUZE/BANDAGES/DRESSINGS) ×3 IMPLANT
BLADE STERNUM SYSTEM 6 (BLADE) ×3 IMPLANT
BLADE SURG ROTATE 9660 (MISCELLANEOUS) IMPLANT
CANISTER SUCTION 2500CC (MISCELLANEOUS) ×3 IMPLANT
CANN PRFSN .5XCNCT 15X34-48 (MISCELLANEOUS) ×2
CANNULA AORTIC HI-FLOW 6.5M20F (CANNULA) ×3 IMPLANT
CANNULA PRFSN .5XCNCT 15X34-48 (MISCELLANEOUS) ×2 IMPLANT
CANNULA VEN 2 STAGE (MISCELLANEOUS) ×1
CATH CPB KIT GERHARDT (MISCELLANEOUS) ×3 IMPLANT
CATH THORACIC 28FR (CATHETERS) ×3 IMPLANT
CATH THORACIC 36FR (CATHETERS) IMPLANT
CATH THORACIC 36FR RT ANG (CATHETERS) IMPLANT
CLIP TI MEDIUM 24 (CLIP) IMPLANT
CLIP TI WIDE RED SMALL 24 (CLIP) IMPLANT
CLOTH BEACON ORANGE TIMEOUT ST (SAFETY) ×3 IMPLANT
COVER SURGICAL LIGHT HANDLE (MISCELLANEOUS) ×6 IMPLANT
CRADLE DONUT ADULT HEAD (MISCELLANEOUS) ×3 IMPLANT
DERMABOND ADHESIVE PROPEN (GAUZE/BANDAGES/DRESSINGS) ×1
DERMABOND ADVANCED .7 DNX6 (GAUZE/BANDAGES/DRESSINGS) ×2 IMPLANT
DRAIN CHANNEL 28F RND 3/8 FF (WOUND CARE) ×3 IMPLANT
DRAIN CHANNEL 32F RND 10.7 FF (WOUND CARE) IMPLANT
DRAPE CARDIOVASCULAR INCISE (DRAPES) ×1
DRAPE SLUSH MACHINE 52X66 (DRAPES) IMPLANT
DRAPE SLUSH/WARMER DISC (DRAPES) ×3 IMPLANT
DRAPE SRG 135X102X78XABS (DRAPES) ×2 IMPLANT
DRSG COVADERM 4X10 (GAUZE/BANDAGES/DRESSINGS) ×3 IMPLANT
DRSG COVADERM 4X14 (GAUZE/BANDAGES/DRESSINGS) ×3 IMPLANT
ELECT BLADE 4.0 EZ CLEAN MEGAD (MISCELLANEOUS) ×3
ELECT CAUTERY BLADE 6.4 (BLADE) ×3 IMPLANT
ELECT REM PT RETURN 9FT ADLT (ELECTROSURGICAL) ×6
ELECTRODE BLDE 4.0 EZ CLN MEGD (MISCELLANEOUS) ×2 IMPLANT
ELECTRODE REM PT RTRN 9FT ADLT (ELECTROSURGICAL) ×4 IMPLANT
GLOVE BIO SURGEON STRL SZ 6 (GLOVE) IMPLANT
GLOVE BIO SURGEON STRL SZ 6.5 (GLOVE) ×12 IMPLANT
GLOVE BIO SURGEON STRL SZ7 (GLOVE) ×9 IMPLANT
GLOVE BIO SURGEON STRL SZ7.5 (GLOVE) ×9 IMPLANT
GLOVE BIOGEL PI IND STRL 6 (GLOVE) IMPLANT
GLOVE BIOGEL PI IND STRL 6.5 (GLOVE) IMPLANT
GLOVE BIOGEL PI IND STRL 7.0 (GLOVE) ×4 IMPLANT
GLOVE BIOGEL PI INDICATOR 6 (GLOVE)
GLOVE BIOGEL PI INDICATOR 6.5 (GLOVE)
GLOVE BIOGEL PI INDICATOR 7.0 (GLOVE) ×2
GLOVE EUDERMIC 7 POWDERFREE (GLOVE) ×3 IMPLANT
GLOVE ORTHO TXT STRL SZ7.5 (GLOVE) IMPLANT
GOWN STRL NON-REIN LRG LVL3 (GOWN DISPOSABLE) ×18 IMPLANT
HEMOSTAT POWDER SURGIFOAM 1G (HEMOSTASIS) ×9 IMPLANT
HEMOSTAT SURGICEL 2X14 (HEMOSTASIS) ×6 IMPLANT
INSERT FOGARTY 61MM (MISCELLANEOUS) IMPLANT
INSERT FOGARTY XLG (MISCELLANEOUS) IMPLANT
KIT BASIN OR (CUSTOM PROCEDURE TRAY) ×3 IMPLANT
KIT ROOM TURNOVER OR (KITS) ×3 IMPLANT
KIT SUCTION CATH 14FR (SUCTIONS) ×6 IMPLANT
KIT VASOVIEW W/TROCAR VH 2000 (KITS) ×3 IMPLANT
LEAD PACING MYOCARDI (MISCELLANEOUS) ×3 IMPLANT
MARKER GRAFT CORONARY BYPASS (MISCELLANEOUS) ×9 IMPLANT
MATRIX HEMOSTAT SURGIFLO (HEMOSTASIS) ×3 IMPLANT
NS IRRIG 1000ML POUR BTL (IV SOLUTION) ×15 IMPLANT
PACK OPEN HEART (CUSTOM PROCEDURE TRAY) ×3 IMPLANT
PAD ARMBOARD 7.5X6 YLW CONV (MISCELLANEOUS) ×6 IMPLANT
PENCIL BUTTON HOLSTER BLD 10FT (ELECTRODE) ×3 IMPLANT
PUNCH AORTIC ROTATE 4.0MM (MISCELLANEOUS) IMPLANT
PUNCH AORTIC ROTATE 4.5MM 8IN (MISCELLANEOUS) IMPLANT
PUNCH AORTIC ROTATE 5MM 8IN (MISCELLANEOUS) IMPLANT
SOLUTION ANTI FOG 6CC (MISCELLANEOUS) IMPLANT
SPONGE GAUZE 4X4 12PLY (GAUZE/BANDAGES/DRESSINGS) ×6 IMPLANT
SPONGE LAP 18X18 X RAY DECT (DISPOSABLE) ×6 IMPLANT
SPONGE LAP 4X18 X RAY DECT (DISPOSABLE) IMPLANT
SPONGE SURGIFOAM ABS GEL 100 (HEMOSTASIS) ×9 IMPLANT
SUT BONE WAX W31G (SUTURE) ×3 IMPLANT
SUT MNCRL AB 4-0 PS2 18 (SUTURE) IMPLANT
SUT PROLENE 3 0 SH DA (SUTURE) IMPLANT
SUT PROLENE 3 0 SH1 36 (SUTURE) ×9 IMPLANT
SUT PROLENE 4 0 RB 1 (SUTURE)
SUT PROLENE 4 0 SH DA (SUTURE) IMPLANT
SUT PROLENE 4 0 TF (SUTURE) ×6 IMPLANT
SUT PROLENE 4-0 RB1 .5 CRCL 36 (SUTURE) IMPLANT
SUT PROLENE 5 0 C 1 36 (SUTURE) ×15 IMPLANT
SUT PROLENE 6 0 C 1 30 (SUTURE) IMPLANT
SUT PROLENE 6 0 CC (SUTURE) ×15 IMPLANT
SUT PROLENE 7 0 BV 1 (SUTURE) IMPLANT
SUT PROLENE 7 0 BV1 MDA (SUTURE) ×6 IMPLANT
SUT PROLENE 7.0 RB 3 (SUTURE) IMPLANT
SUT PROLENE 8 0 BV175 6 (SUTURE) ×21 IMPLANT
SUT SILK  1 MH (SUTURE)
SUT SILK 1 MH (SUTURE) IMPLANT
SUT SILK 2 0 SH CR/8 (SUTURE) IMPLANT
SUT SILK 3 0 SH CR/8 (SUTURE) IMPLANT
SUT STEEL 6MS V (SUTURE) ×3 IMPLANT
SUT STEEL STERNAL CCS#1 18IN (SUTURE) IMPLANT
SUT STEEL SZ 6 DBL 3X14 BALL (SUTURE) ×3 IMPLANT
SUT VIC AB 1 CTX 18 (SUTURE) ×6 IMPLANT
SUT VIC AB 1 CTX 36 (SUTURE)
SUT VIC AB 1 CTX36XBRD ANBCTR (SUTURE) IMPLANT
SUT VIC AB 2-0 CT1 27 (SUTURE) ×1
SUT VIC AB 2-0 CT1 TAPERPNT 27 (SUTURE) ×2 IMPLANT
SUT VIC AB 2-0 CTX 27 (SUTURE) IMPLANT
SUT VIC AB 3-0 SH 27 (SUTURE)
SUT VIC AB 3-0 SH 27X BRD (SUTURE) IMPLANT
SUT VIC AB 3-0 X1 27 (SUTURE) ×3 IMPLANT
SUT VICRYL 4-0 PS2 18IN ABS (SUTURE) IMPLANT
SUTURE E-PAK OPEN HEART (SUTURE) ×3 IMPLANT
SYSTEM SAHARA CHEST DRAIN ATS (WOUND CARE) ×3 IMPLANT
TAPE CLOTH SURG 4X10 WHT LF (GAUZE/BANDAGES/DRESSINGS) ×3 IMPLANT
TOWEL OR 17X24 6PK STRL BLUE (TOWEL DISPOSABLE) ×6 IMPLANT
TOWEL OR 17X26 10 PK STRL BLUE (TOWEL DISPOSABLE) ×6 IMPLANT
TRAY FOLEY IC TEMP SENS 14FR (CATHETERS) ×3 IMPLANT
TUBE FEEDING 8FR 16IN STR KANG (MISCELLANEOUS) ×3 IMPLANT
TUBE SUCT INTRACARD DLP 20F (MISCELLANEOUS) ×3 IMPLANT
TUBING INSUFFLATION 10FT LAP (TUBING) ×3 IMPLANT
UNDERPAD 30X30 INCONTINENT (UNDERPADS AND DIAPERS) ×3 IMPLANT
WATER STERILE IRR 1000ML POUR (IV SOLUTION) ×6 IMPLANT

## 2012-03-26 NOTE — Brief Op Note (Addendum)
                   [address]            [place] [address]          [phone]    03/24/2012 - 03/26/2012  11:57 AM  PATIENT:  Dorinda Hill  71 y.o. male  PRE-OPERATIVE DIAGNOSIS:  coronary artery diease  POST-OPERATIVE DIAGNOSIS:  coronary artery diseas  PROCEDURE:  Procedure(s): CORONARY ARTERY BYPASS GRAFTING (CABG)X 5 LIMA-LAD; SEQ SVG -OM1-OM2; SEQ SVG- RCA- DIST CIRC ENDOVEIN HARVEST OF GREATER SAPHENOUS VEIN RIGHT LEG  SURGEON:  Surgeon(s): Delight Ovens, MD  PHYSICIAN ASSISTANT: WAYNE GOLD PA-C  ANESTHESIA:   general  PATIENT CONDITION:  ICU - intubated and hemodynamically stable.  PRE-OPERATIVE WEIGHT: 89kg  COMPLICATIONS: NO KNOWN

## 2012-03-26 NOTE — Procedures (Signed)
Extubation Procedure Note  Patient Details:   Name: Menno Vanbergen DOB: 10-Oct-1940 MRN: [id number]   Airway Documentation:   Patient extubated to 4 lpm nasal cannula.  VC 1000 ml, NIF -30, able to hold head off bed 10 seconds.  Patient able to breathe around deflated cuff and vocalize post procedure.  No complications noted, patient tolerated well.   Evaluation  O2 sats: stable throughout Complications: No apparent complications Patient did tolerate procedure well. Bilateral Breath Sounds: Clear   Yes  Janetta Vandoren, Aloha Gell 03/26/2012, 8:52 PM

## 2012-03-26 NOTE — Preoperative (Signed)
Beta Blockers   Reason not to administer Beta Blockers:Metoprolol 0458 03/26/2012

## 2012-03-26 NOTE — Anesthesia Postprocedure Evaluation (Signed)
  Anesthesia Post-op Note  Patient: Jon Mcgrath  Procedure(s) Performed: Procedure(s) (LRB): CORONARY ARTERY BYPASS GRAFTING (CABG) (N/A) ENDOVEIN HARVEST OF GREATER SAPHENOUS VEIN (N/A)  Patient Location: SICU  Anesthesia Type: General  Level of Consciousness: sedated and Patient remains intubated per anesthesia plan  Airway and Oxygen Therapy: Patient remains intubated per anesthesia plan and Patient placed on Ventilator (see vital sign flow sheet for setting)  Post-op Pain: Pt still "asleep", no apparent discomfort  Post-op Assessment: Post-op Vital signs reviewed, Patient's Cardiovascular Status Stable, Respiratory Function Stable and Patent Airway  Post-op Vital Signs: Reviewed and stable  Complications: No apparent anesthesia complications

## 2012-03-26 NOTE — Transfer of Care (Signed)
Immediate Anesthesia Transfer of Care Note  Patient: Jon Mcgrath  Procedure(s) Performed: Procedure(s) (LRB): CORONARY ARTERY BYPASS GRAFTING (CABG) (N/A) ENDOVEIN HARVEST OF GREATER SAPHENOUS VEIN (N/A)  Patient Location: SICU  Anesthesia Type: General  Level of Consciousness: sedated and Patient remains intubated per anesthesia plan  Airway & Oxygen Therapy: Patient remains intubated per anesthesia plan and Patient placed on Ventilator (see vital sign flow sheet for setting)  Post-op Assessment: Post -op Vital signs reviewed and stable and report to SICU RN.  Post vital signs: Reviewed and stable  Complications: No apparent anesthesia complications

## 2012-03-26 NOTE — Anesthesia Procedure Notes (Signed)
Procedure Name: Intubation Date/Time: 03/26/2012 7:58 AM Performed by: Marni Griffon Pre-anesthesia Checklist: Patient identified, Emergency Drugs available, Suction available and Patient being monitored Patient Re-evaluated:Patient Re-evaluated prior to inductionOxygen Delivery Method: Circle system utilized Preoxygenation: Pre-oxygenation with 100% oxygen Intubation Type: IV induction Ventilation: Mask ventilation without difficulty Laryngoscope Size: Mac and 3 Grade View: Grade I Tube type: Oral Tube size: 8.5 mm Number of attempts: 1 Airway Equipment and Method: Stylet Placement Confirmation: ETT inserted through vocal cords under direct vision,  breath sounds checked- equal and bilateral and positive ETCO2 Secured at: 22 (cm at teeth) cm Tube secured with: Tape Dental Injury: Teeth and Oropharynx as per pre-operative assessment

## 2012-03-26 NOTE — Anesthesia Preprocedure Evaluation (Addendum)
Anesthesia Evaluation  Patient identified by MRN, date of birth, ID band Patient awake    Reviewed: Allergy & Precautions, H&P , NPO status , Patient's Chart, lab work & pertinent test results, reviewed documented beta blocker date and time   History of Anesthesia Complications Negative for: history of anesthetic complications  Airway Mallampati: II TM Distance: >3 FB   Mouth opening: Limited Mouth Opening  Dental  (+) Dental Advisory Given, Edentulous Upper and Edentulous Lower   Pulmonary neg pulmonary ROS,    Pulmonary exam normal       Cardiovascular hypertension, Pt. on medications and Pt. on home beta blockers + Past MI Rhythm:Regular Rate:Normal     Neuro/Psych negative neurological ROS  negative psych ROS   GI/Hepatic Neg liver ROS, GERD-  Medicated,  Endo/Other  negative endocrine ROS  Renal/GU negative Renal ROS     Musculoskeletal   Abdominal   Peds  Hematology   Anesthesia Other Findings   Reproductive/Obstetrics                         Anesthesia Physical Anesthesia Plan  ASA: IV  Anesthesia Plan: General   Post-op Pain Management:    Induction: Intravenous  Airway Management Planned: Oral ETT  Additional Equipment: Arterial line and PA Cath  Intra-op Plan:   Post-operative Plan: Post-operative intubation/ventilation  Informed Consent: I have reviewed the patients History and Physical, chart, labs and discussed the procedure including the risks, benefits and alternatives for the proposed anesthesia with the patient or authorized representative who has indicated his/her understanding and acceptance.   Dental advisory given  Plan Discussed with: CRNA, Anesthesiologist and Surgeon  Anesthesia Plan Comments:        Anesthesia Quick Evaluation

## 2012-03-26 NOTE — Progress Notes (Signed)
TCTS BRIEF SICU PROGRESS NOTE  Day of Surgery  S/P Procedure(s) (LRB): CORONARY ARTERY BYPASS GRAFTING (CABG) (N/A) ENDOVEIN HARVEST OF GREATER SAPHENOUS VEIN (N/A)   Sedated on vent AAI paced, BP stable C.I. 1.6 PA pressures 32/16 Chest tube output low UOP > 150 mL/hr Labs okay  Plan: Continue routine early postop  Jon Mcgrath H 03/26/2012 6:52 PM

## 2012-03-27 ENCOUNTER — Inpatient Hospital Stay (HOSPITAL_COMMUNITY): Payer: Medicare Other

## 2012-03-27 LAB — GLUCOSE, CAPILLARY
Glucose-Capillary: 114 mg/dL — ABNORMAL HIGH (ref 70–99)
Glucose-Capillary: 121 mg/dL — ABNORMAL HIGH (ref 70–99)
Glucose-Capillary: 121 mg/dL — ABNORMAL HIGH (ref 70–99)
Glucose-Capillary: 125 mg/dL — ABNORMAL HIGH (ref 70–99)
Glucose-Capillary: 133 mg/dL — ABNORMAL HIGH (ref 70–99)
Glucose-Capillary: 140 mg/dL — ABNORMAL HIGH (ref 70–99)

## 2012-03-27 LAB — CBC
MCV: 88.6 fL (ref 78.0–100.0)
MCV: 89.4 fL (ref 78.0–100.0)
Platelets: 116 10*3/uL — ABNORMAL LOW (ref 150–400)
Platelets: 109 10*3/uL — ABNORMAL LOW (ref 150–400)
RBC: 3.5 MIL/uL — ABNORMAL LOW (ref 4.22–5.81)
RBC: 3.48 MIL/uL — ABNORMAL LOW (ref 4.22–5.81)
WBC: 12.1 10*3/uL — ABNORMAL HIGH (ref 4.0–10.5)
WBC: 11.9 10*3/uL — ABNORMAL HIGH (ref 4.0–10.5)

## 2012-03-27 LAB — POCT I-STAT, CHEM 8
BUN: 11 mg/dL (ref 6–23)
BUN: 16 mg/dL (ref 6–23)
Calcium, Ion: 1.13 mmol/L (ref 1.12–1.32)
Calcium, Ion: 1.16 mmol/L (ref 1.12–1.32)
Chloride: 108 mEq/L (ref 96–112)
Chloride: 105 mEq/L (ref 96–112)
Creatinine, Ser: 0.9 mg/dL (ref 0.50–1.35)
Creatinine, Ser: 1.1 mg/dL (ref 0.50–1.35)
Glucose, Bld: 159 mg/dL — ABNORMAL HIGH (ref 70–99)
Glucose, Bld: 122 mg/dL — ABNORMAL HIGH (ref 70–99)
HCT: 31 % — ABNORMAL LOW (ref 39.0–52.0)
HCT: 29 % — ABNORMAL LOW (ref 39.0–52.0)
Hemoglobin: 10.5 g/dL — ABNORMAL LOW (ref 13.0–17.0)
Hemoglobin: 9.9 g/dL — ABNORMAL LOW (ref 13.0–17.0)
Potassium: 4.4 mEq/L (ref 3.5–5.1)
Potassium: 4 mEq/L (ref 3.5–5.1)
Sodium: 141 mEq/L (ref 135–145)
Sodium: 141 mEq/L (ref 135–145)
TCO2: 19 mmol/L (ref 0–100)
TCO2: 23 mmol/L (ref 0–100)

## 2012-03-27 LAB — BASIC METABOLIC PANEL
CO2: 22 mEq/L (ref 19–32)
Chloride: 108 mEq/L (ref 96–112)
Potassium: 3.8 mEq/L (ref 3.5–5.1)
Sodium: 140 mEq/L (ref 135–145)

## 2012-03-27 LAB — HEPATIC FUNCTION PANEL
ALT: 14 U/L (ref 0–53)
AST: 14 U/L (ref 0–37)
Albumin: 3 g/dL — ABNORMAL LOW (ref 3.5–5.2)
Alkaline Phosphatase: 32 U/L — ABNORMAL LOW (ref 39–117)
Bilirubin, Direct: 0.1 mg/dL (ref 0.0–0.3)
Indirect Bilirubin: 0.3 mg/dL (ref 0.3–0.9)
Total Bilirubin: 0.4 mg/dL (ref 0.3–1.2)
Total Protein: 5.5 g/dL — ABNORMAL LOW (ref 6.0–8.3)

## 2012-03-27 LAB — CREATININE, SERUM
Creatinine, Ser: 1.15 mg/dL (ref 0.50–1.35)
GFR calc Af Amer: 73 mL/min — ABNORMAL LOW (ref >90)
GFR calc non Af Amer: 63 mL/min — ABNORMAL LOW (ref >90)

## 2012-03-27 LAB — MAGNESIUM: Magnesium: 2.4 mg/dL (ref 1.5–2.5)

## 2012-03-27 LAB — TSH: TSH: 1.407 u[IU]/mL (ref 0.350–4.500)

## 2012-03-27 MED ORDER — AMIODARONE HCL IN DEXTROSE 360-4.14 MG/200ML-% IV SOLN
0.5000 mg/min | INTRAVENOUS | Status: DC
  Filled 2012-03-27 (×5): qty 200

## 2012-03-27 MED ORDER — INSULIN ASPART 100 UNIT/ML ~~LOC~~ SOLN
0.0000 [IU] | SUBCUTANEOUS | Status: DC
  Administered 2012-03-27 – 2012-03-28 (×5): 2 [IU] via SUBCUTANEOUS

## 2012-03-27 MED ORDER — AMIODARONE HCL 200 MG PO TABS: 200.0000 mg | ORAL_TABLET | Freq: Every day | ORAL | Status: DC

## 2012-03-27 MED ORDER — AMIODARONE HCL IN DEXTROSE 360-4.14 MG/200ML-% IV SOLN
1.0000 mg/min | INTRAVENOUS | Status: DC
  Administered 2012-03-27: 60 mg/h via INTRAVENOUS

## 2012-03-27 MED ORDER — AMIODARONE LOAD VIA INFUSION
150.0000 mg | Freq: Once | INTRAVENOUS | Status: AC
  Administered 2012-03-27: 150 mg via INTRAVENOUS
  Filled 2012-03-27: qty 83.34

## 2012-03-27 MED ORDER — AMIODARONE HCL 200 MG PO TABS
200.0000 mg | ORAL_TABLET | Freq: Two times a day (BID) | ORAL | Status: DC
  Administered 2012-03-28 (×2): 200 mg via ORAL
  Filled 2012-03-27 (×4): qty 1

## 2012-03-27 MED ORDER — AMIODARONE HCL IN DEXTROSE 360-4.14 MG/200ML-% IV SOLN
INTRAVENOUS | Status: AC
  Administered 2012-03-27: 60 mg/h via INTRAVENOUS
  Filled 2012-03-27: qty 200

## 2012-03-27 NOTE — Op Note (Signed)
Jon Mcgrath, Jon Mcgrath NO.:  [id number]  MEDICAL RECORD NO.:  [id number]  LOCATION:  2314                         FACILITY:  [facility]  PHYSICIAN:  Sheliah Plane, MD    DATE OF BIRTH:  November 22, 1940  DATE OF PROCEDURE:  03/26/2012 DATE OF DISCHARGE:                              OPERATIVE REPORT   PREOPERATIVE DIAGNOSIS:  Recent non-STEMI myocardial infarction with three-vessel coronary artery occlusive disease.  POSTOPERATIVE DIAGNOSIS:  Recent non-STEMI myocardial infarction with three-vessel coronary artery occlusive disease.  SURGICAL PROCEDURE:  Coronary artery bypass grafting x5 with the left internal mammary to the left anterior descending coronary artery, sequential reverse saphenous vein graft to the first and second obtuse marginals, sequential reverse saphenous vein graft to the distal right coronary artery, and distal circumflex with right thigh and calf EndoVein harvesting.  SURGEON:  Sheliah Plane, MD  FIRST ASSISTANT:  Rowe Clack, PA-C  BRIEF HISTORY:  The patient is a 71 year old male with known coronary occlusive disease having had a stent placed in his LAD 10-12 years prior.  He had done well until 2 days before transfer when he began having increasing shortness of breath and chest discomfort and was seen at [facility].  Troponins were elevated.  He was stabilized medically and on March 23, 2012, underwent cardiac catheterization by Dr. Lady Gary which showed high-grade LAD lesion with restenosis in the stent and just after the stent.  In addition, he had 80% disease in the circumflex involving the 1st obtuse marginal, 90% stenosis in the 2nd obtuse marginal, a small nondominant right with anomalous takeoff, that was totally occluded distally and a small distal circumflex branch with 80% stenosis.  Overall, ventricular function was preserved.  Because of the patient's symptoms and three-vessel coronary artery disease, he was transferred  to [facility] for consideration of coronary artery bypass grafting.  Risks and options of surgery were discussed with the patient's family in detail and he was willing to proceed and signed informed consent.  DESCRIPTION OF PROCEDURE:  With Swan-Ganz and arterial line monitors in place, the patient underwent general endotracheal anesthesia without incidence.  Skin of the chest and legs were prepped with Betadine and draped in the usual sterile manner.  Using the [employer] EndoVein harvesting system, vein was harvested from the right thigh and calf and was of good quality and caliber.  Median sternotomy was performed.  Left internal mammary artery was dissected down as pedicle graft.  The distal artery was divided, had good free flow.  Pericardium was opened. Overall, ventricular function appeared to be preserved.  The patient was systemically heparinized.  The ascending aorta was cannulated.  The right atrium was cannulated and aortic root vent cardioplegia needle was introduced into the ascending aorta.  The patient was placed on cardiopulmonary bypass 2.4 L/minute per m square.  Sites of anastomosis were dissected out of the epicardium.  The patient's body temperature cooled to 32 degrees.  Aortic crossclamp was applied, 500 mL cold blood potassium cardioplegia was administered with diastolic arrest of the heart.  Myocardial septal temperatures monitored throughout the crossclamp.  Attention was turned 1st to the circumflex system where the 1st obtuse marginal was  partially intramyocardial.  This vessel was opened and admitted a 1.5 mm probe using a diamond type side-to-side anastomosis, was carried out with a 2nd reverse saphenous vein graft. The distal extent of the same vein was extended to the 2nd obtuse marginal which was slightly larger, but just admitted a 1.5 mm probe. Using a running 7-0 Prolene, distal anastomosis was performed. Additional cold blood cardioplegia was  administered down the vein grafts.  Attention was then turned to the right system, where a small distal right coronary artery was identified and opened.  It was approximately 1.3-1.4 mm in size.  Using a longitudinal side-to-side anastomosis was carried out with a running 8-0 Prolene.  Distal extent of the same vein was then anastomosed to the distal circumflex coronary artery, which was also a small vessel approximately 1.3 mm in size. Additional cold blood cardioplegia was administered down the vein grafts.  Attention was then turned to the left anterior descending coronary artery between the mid and distal 3rd of the vessel was opened and was of good quality.  A 1.5 mm probe easily passed distally using a running 8-0 Prolene.  Left internal mammary artery was anastomosed to the left anterior descending coronary artery. There was prompt rise in myocardial septal temperature with removal of bulldog on the mammary artery.  The bulldog was placed back on the mammary artery.  Additional cold blood cardioplegia was administered.  With crossclamp still in place, 2 punch aortotomies were performed and each of the 2 vein grafts were anastomosed to the ascending aorta.  There was a significant amount of calcific plaque in the very proximal aorta on the right lateral side. The vein graft to the right coronary artery was taken off above this. Air was evacuated from the ascending aorta and grafts and aortic crossclamp was removed.  Total crossclamp time was 99 minutes.  Sites of the anastomosis were inspected.  The patient spontaneously converted to a sinus rhythm.  He was atrially paced to increase rate.  With the body temperature rewarmed to 37 degrees, he was then ventilated and weaned from cardiopulmonary bypass without difficulty.  He remained hemodynamically stable.  He was decannulated in usual fashion. Protamine sulfate was administered with operative field hemostatic. Atrial and  ventricular pacing wires had been applied.  The left pleural tube was left in place.  Mediastinal tube was left in place. Pericardium was reapproximated.  Sternum was closed with #6 stainless steel wire.  Fascia closed with interrupted 0 Vicryl, running 3-0 Vicryl and subcutaneous tissue, and 4-0 subcuticular stitch in skin edges.  Dry dressings were applied.  Sponge and needle count was reported as correct at completion of the procedure.  The patient tolerated the procedure without obvious complication and was transferred to Surgical Intensive Care Unit for further postoperative care.  He did not require any blood bank blood products.     Sheliah Plane, MD     EG/MEDQ  D:  03/27/2012  T:  03/27/2012  Job:  [id number]  cc:   Harold Hedge, MD

## 2012-03-27 NOTE — Progress Notes (Signed)
Rapid A-fib on IV amiodarone PM labs adequate, maintaining BP > 100 SAP

## 2012-03-27 NOTE — Care Management Note (Unsigned)
    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  PTA, PT INDEPENDENT, LIVES WITH SPOUSE.     Action/Plan:   MET WITH PT AND FAMILY TO DISCUSS DC PLANS.  WIFE AND SON TO PROVIDE 24HR CARE AT DISCHARGE.  WILL FOLLOW FOR HOME NEEDS AS PT PROGRESSES.   Anticipated DC Date:  03/31/2012   Anticipated DC Plan:  HOME W HOME HEALTH SERVICES      DC Planning Services  CM consult      Choice offered to / List presented to:     DME arranged  Levan Hurst      DME agency  [facility]        Status of service:  In process, will continue to follow Medicare Important Message given?   (If response is "NO", the following Medicare IM given date fields will be blank) Date Medicare IM given:   Date Additional Medicare IM given:    Discharge Disposition:    Per UR Regulation:    If discussed at Long Length of Stay Meetings, dates discussed:    Comments:  03/30/12 Georgann Bramble,RN,BSN [phone] 1030AM PT REQUESTS RW FOR HOME.  REFERRAL TO [facility] FOR DME NEEDS.

## 2012-03-27 NOTE — Progress Notes (Deleted)
Versed gtt (20mg ) and fentanyl gtt ( ) wasted down sink. 2 nurse witness. Fatima Sanger RN, Ulis Rias RN

## 2012-03-27 NOTE — Progress Notes (Signed)
Patient ID: Jon Mcgrath, male   DOB: Sep 11, 1941, 71 y.o.   MRN: [id number] TCTS DAILY PROGRESS NOTE                   [address]            [place] [address]          [phone]      1 Day Post-Op Procedure(s) (LRB): CORONARY ARTERY BYPASS GRAFTING (CABG) (N/A) ENDOVEIN HARVEST OF GREATER SAPHENOUS VEIN (N/A)  Total Length of Stay:  LOS: 3 days   Subjective: Awake and alert, extubated   Objective: Vital signs in last 24 hours: Temp:  [96.8 F (36 C)-99.5 F (37.5 C)] 99 F (37.2 C) (06/25 0600) Pulse Rate:  [89-101] 93  (06/25 0600) Cardiac Rhythm:  [-] Normal sinus rhythm (06/25 0600) Resp:  [12-20] 17  (06/25 0600) BP: (90-121)/(48-75) 111/62 mmHg (06/25 0600) SpO2:  [92 %-100 %] 96 % (06/25 0600) Arterial Line BP: (87-170)/(55-78) 147/64 mmHg (06/25 0600) FiO2 (%):  [40 %-50.4 %] 40 % (06/24 2007) Weight:  [212 lb 1.3 oz (96.2 kg)] 212 lb 1.3 oz (96.2 kg) (06/25 0600)  Filed Weights   03/25/12 0601 03/26/12 0435 03/27/12 0600  Weight: 198 lb 8 oz (90.039 kg) 197 lb 12.8 oz (89.721 kg) 212 lb 1.3 oz (96.2 kg)    Weight change: 14 lb 4.5 oz (6.478 kg)   Hemodynamic parameters for last 24 hours: PAP: (25-34)/(14-20) 32/16 mmHg CO:  [2.9 L/min-6.3 L/min] 6.3 L/min CI:  [1.4 L/min/m2-3.3 L/min/m2] 3.3 L/min/m2  Intake/Output from previous day: 06/24 0701 - 06/25 0700 In: 7284.2 [I.V.:5768.2; Blood:416; IV Piggyback:1100] Out: 5738 [Urine:2970; Blood:2518; Chest Tube:250]  Intake/Output this shift:    Current Meds: Scheduled Meds:   . acetaminophen (TYLENOL) oral liquid 160 mg/5 mL  650 mg Per Tube NOW   Or  . acetaminophen  650 mg Rectal NOW  . acetaminophen  1,000 mg Oral Q6H   Or  . acetaminophen (TYLENOL) oral liquid 160 mg/5 mL  975 mg Per Tube Q6H  . aspirin EC  325 mg Oral Daily   Or  . aspirin  324 mg Per Tube Daily  . bisacodyl  10 mg Oral Daily   Or  . bisacodyl  10 mg Rectal Daily  . cefUROXime (ZINACEF)  IV  1.5 g  Intravenous To OR  . cefUROXime (ZINACEF)  IV  1.5 g Intravenous Q12H  . dexmedetomidine (PRECEDEX) IV infusion for high rates  0.1-0.7 mcg/kg/hr Intravenous To OR  . docusate sodium  200 mg Oral Daily  . insulin aspart  0-24 Units Subcutaneous Q2H   Followed by  . insulin aspart  0-24 Units Subcutaneous Q4H  . insulin (NOVOLIN-R) infusion   Intravenous To OR  . insulin regular  0-10 Units Intravenous TID WC  . magnesium sulfate  4 g Intravenous Once  . metoprolol tartrate  12.5 mg Oral BID   Or  . metoprolol tartrate  12.5 mg Per Tube BID  . nitroGLYCERIN  2-200 mcg/min Intravenous To OR  . nitroglycerin-nicardipine-HEPARIN-sodium bicarbonate irrigation for artery spasm   Irrigation To OR  . pantoprazole  40 mg Oral Q1200  . potassium chloride  10 mEq Intravenous Q1 Hr x 3  . sodium chloride  3 mL Intravenous Q12H  . tranexamic acid  15 mg/kg Intravenous To OR  . tranexamic acid (CYKLOKAPRON) infusion ([facility])  1.5 mg/kg/hr Intravenous To OR  . vancomycin  1,500 mg Intravenous To OR  .  vancomycin  1,000 mg Intravenous Once                                                                                       Continuous Infusions:   . sodium chloride 20 mL/hr at 03/27/12 0600  . sodium chloride Stopped (03/26/12 1459)  . sodium chloride    . dexmedetomidine (PRECEDEX) IV infusion Stopped (03/26/12 1700)  . insulin (NOVOLIN-R) infusion Stopped (03/26/12 1700)  . lactated ringers 20 mL/hr at 03/27/12 0600  . nitroGLYCERIN Stopped (03/26/12 1506)  . phenylephrine (NEO-SYNEPHRINE) Adult infusion Stopped (03/26/12 2100)   PRN Meds:.albumin human, lactated ringers, metoprolol, midazolam, morphine injection, morphine injection, ondansetron (ZOFRAN) IV, oxyCODONE, sodium chloride, DISCONTD: 0.9 % irrigation (POUR BTL), DISCONTD: hemostatic agents, DISCONTD: hemostatic agents, DISCONTD:  morphine injection, DISCONTD: Surgifoam 1 Gm with 0.9% sodium chloride (4 ml) topical  solution  General appearance: alert, cooperative and no distress Neurologic: intact Heart: regular rate and rhythm, S1, S2 normal, no murmur, click, rub or gallop and normal apical impulse Lungs: clear to auscultation bilaterally and normal percussion bilaterally Abdomen: soft, non-tender; bowel sounds normal; no masses,  no organomegaly Extremities: extremities normal, atraumatic, no cyanosis or edema and Homans sign is negative, no sign of DVT Wound: sternum stable  Lab Results: CBC: Basename 03/27/12 0345 03/26/12 2105 03/26/12 2100  WBC 12.1* -- 12.7*  HGB 10.3* 10.5* --  HCT 31.0* 31.0* --  PLT 116* -- 110*   BMET:  Basename 03/27/12 0345 03/26/12 2105 03/25/12 0545  NA 140 141 --  K 3.8 4.4 --  CL 108 108 --  CO2 22 -- 24  GLUCOSE 129* 159* --  BUN 12 11 --  CREATININE 0.96 0.90 --  CALCIUM 7.8* -- 8.9    PT/INR:  Basename 03/26/12 1445  LABPROT 16.5*  INR 1.31   Radiology: Dg Chest Portable 1 View  03/26/2012  *RADIOLOGY REPORT*  Clinical Data: Postop CABG.  PORTABLE CHEST - 1 VIEW  Comparison: None.  Findings: Changes of prior CABG.  Swan-Ganz catheter tip is in the main pulmonary artery.  Endotracheal tube is 3 cm above the carina. Left chest tube in place without pneumothorax.  NG tube is in the stomach.  Left base atelectasis.  Mild vascular congestion and borderline heart size.  IMPRESSION: Postoperative changes.  Support devices in expected position.  No pneumothorax.  Left base atelectasis, mild vascular congestion.  Original Report Authenticated By: Cyndie Chime, M.D.     Assessment/Plan: S/P Procedure(s) (LRB): CORONARY ARTERY BYPASS GRAFTING (CABG) (N/A) ENDOVEIN HARVEST OF GREATER SAPHENOUS VEIN (N/A) Mobilize Diuresis Diabetes control d/c tubes/lines See progression orders Expected Acute  Blood - loss Anemia, no transfusion needed     Delight Ovens MD  Beeper [phone] Office [phone] 03/27/2012 8:03 AM

## 2012-03-27 NOTE — Consult Note (Signed)
Pharmacy note: Amiodarone Drug Interactions  There are > 700 drugs that are known to interact with Amiodarone.    The clinically significant interactions with Amiodarone include Zocor, Lasix (increased risk for hypokalemia), Coumadin, Quinolones (Levofloxacin, Moxifloxacin, but NOT as much Ciprofloxacin), Azithromycin, Digoxin, Loratidine.  Other drugs are not as common, or less likely to have clinically significant interactions.  There are numerous lists available with variable risks when given concurrently with Amiodarone.  Other useful references address QTc prolongation and the added risks for Torsades.  (www.azcert.org).    Grapefruit juice may increase the blood levels and side effects of various drug metabolized by the cytochrome-P450 system - this includes dihydropyridine calcium channel blockers (verapamil, plendil, adalat, procardia, norvasc and others), some HMG Co-A reductase inhibitors (Zocor, Lipitor, Mevacor but Not Pravachol or Lescol).  Crestor has a lower risk for adverse interactions with Amiodarone.  General Recommendations: 1.  Avoid Zocor (simvastatin).  Crestor has the lowest risk for adverse interactions. 2.  Avoid the quinolones (concomitant blockade of cardiac K+ channels).  Cipro has lower risk for QTc prolongation than other quinolones.  PCN's and      Cephalosporins may be alternative choice. 3.  Azithromycin and Erythromycin both may enhance QTc prolongation and should be avoided or used cautiously. 4.  Digoxin levels may be increased by interaction with Amiodarone. 5.  Coumadin and Amiodarone's interaction is a commonly recognized one.  Monitor INR's closely. 6.  Loratidine has dose-related effects on QTc prolongation.  Amiodarone can increase loratidine levels and increase the risk of Torsades.  Would suggest other antihistamines. 7.  Lasix may reduce K+ and Mg++ concentrations and increase the risk of ventricular arrhythmias.  (Also includes any K+-wasting diuretics,  Ampho B, Kayexalate, and excessive stimulant laxatives). 8.  Avoid Grapefruit juice.  Currently: 1.  Your patient is not currently taking any medication with any clinically significant interactions with Amiodarone.  However, he was on Lisinopril-HCTZ prior to admission and if restarted, may reduce K+ and Mg++ concentrations as listed above.  If you have any specific questions or issues, please ask.   Thank you.   Kaitlen Redford, Elisha Headland, Pharm.D.   03/27/2012 7:15 PM

## 2012-03-28 ENCOUNTER — Inpatient Hospital Stay (HOSPITAL_COMMUNITY): Payer: Medicare Other

## 2012-03-28 LAB — GLUCOSE, CAPILLARY
Glucose-Capillary: 116 mg/dL — ABNORMAL HIGH (ref 70–99)
Glucose-Capillary: 134 mg/dL — ABNORMAL HIGH (ref 70–99)
Glucose-Capillary: 135 mg/dL — ABNORMAL HIGH (ref 70–99)
Glucose-Capillary: 81 mg/dL (ref 70–99)
Glucose-Capillary: 95 mg/dL (ref 70–99)
Glucose-Capillary: 98 mg/dL (ref 70–99)

## 2012-03-28 LAB — BASIC METABOLIC PANEL
BUN: 20 mg/dL (ref 6–23)
CO2: 25 mEq/L (ref 19–32)
Calcium: 7.7 mg/dL — ABNORMAL LOW (ref 8.4–10.5)
Chloride: 105 mEq/L (ref 96–112)
Creatinine, Ser: 1.18 mg/dL (ref 0.50–1.35)
GFR calc Af Amer: 71 mL/min — ABNORMAL LOW (ref >90)
GFR calc non Af Amer: 61 mL/min — ABNORMAL LOW (ref >90)
Glucose, Bld: 141 mg/dL — ABNORMAL HIGH (ref 70–99)
Potassium: 3.6 mEq/L (ref 3.5–5.1)
Sodium: 137 mEq/L (ref 135–145)

## 2012-03-28 LAB — MAGNESIUM: Magnesium: 2.5 mg/dL (ref 1.5–2.5)

## 2012-03-28 LAB — CBC
HCT: 30.2 % — ABNORMAL LOW (ref 39.0–52.0)
Hemoglobin: 9.9 g/dL — ABNORMAL LOW (ref 13.0–17.0)
MCH: 29.4 pg (ref 26.0–34.0)
MCHC: 32.8 g/dL (ref 30.0–36.0)
MCV: 89.6 fL (ref 78.0–100.0)
Platelets: 114 10*3/uL — ABNORMAL LOW (ref 150–400)
RBC: 3.37 MIL/uL — ABNORMAL LOW (ref 4.22–5.81)
RDW: 15.3 % (ref 11.5–15.5)
WBC: 11.5 10*3/uL — ABNORMAL HIGH (ref 4.0–10.5)

## 2012-03-28 MED ORDER — TRAMADOL HCL 50 MG PO TABS
50.0000 mg | ORAL_TABLET | ORAL | Status: DC | PRN
  Administered 2012-03-30: 50 mg via ORAL
  Administered 2012-03-30: 100 mg via ORAL
  Filled 2012-03-28: qty 1
  Filled 2012-03-28: qty 2

## 2012-03-28 MED ORDER — PANTOPRAZOLE SODIUM 40 MG PO TBEC
40.0000 mg | DELAYED_RELEASE_TABLET | Freq: Every day | ORAL | Status: DC
  Administered 2012-03-29 – 2012-03-31 (×3): 40 mg via ORAL
  Filled 2012-03-28 (×3): qty 1

## 2012-03-28 MED ORDER — BISACODYL 5 MG PO TBEC
10.0000 mg | DELAYED_RELEASE_TABLET | Freq: Every day | ORAL | Status: DC | PRN
  Administered 2012-03-28: 10 mg via ORAL

## 2012-03-28 MED ORDER — METOPROLOL TARTRATE 12.5 MG HALF TABLET
12.5000 mg | ORAL_TABLET | Freq: Two times a day (BID) | ORAL | Status: DC
  Administered 2012-03-28 – 2012-03-31 (×7): 12.5 mg via ORAL
  Filled 2012-03-28 (×8): qty 1

## 2012-03-28 MED ORDER — SODIUM CHLORIDE 0.9 % IJ SOLN: 3.0000 mL | INTRAMUSCULAR | Status: DC | PRN

## 2012-03-28 MED ORDER — OXYCODONE HCL 5 MG PO TABS
5.0000 mg | ORAL_TABLET | ORAL | Status: DC | PRN
  Administered 2012-03-28 – 2012-03-30 (×5): 10 mg via ORAL
  Filled 2012-03-28 (×5): qty 2

## 2012-03-28 MED ORDER — SODIUM CHLORIDE 0.9 % IV SOLN: 250.0000 mL | INTRAVENOUS | Status: DC | PRN

## 2012-03-28 MED ORDER — ALUM HYDROXIDE-MAG CARBONATE 95-358 MG/15ML PO SUSP
15.0000 mL | ORAL | Status: DC | PRN
  Filled 2012-03-28: qty 15

## 2012-03-28 MED ORDER — POTASSIUM CHLORIDE 10 MEQ/50ML IV SOLN
INTRAVENOUS | Status: AC
  Filled 2012-03-28: qty 150

## 2012-03-28 MED ORDER — MOVING RIGHT ALONG BOOK
Freq: Once | Status: AC
  Administered 2012-03-28: 09:00:00
  Filled 2012-03-28: qty 1

## 2012-03-28 MED ORDER — ONDANSETRON HCL 4 MG/2ML IJ SOLN
4.0000 mg | Freq: Four times a day (QID) | INTRAMUSCULAR | Status: DC | PRN
  Administered 2012-03-31: 4 mg via INTRAVENOUS
  Filled 2012-03-28: qty 2

## 2012-03-28 MED ORDER — ALUM & MAG HYDROXIDE-SIMETH 200-200-20 MG/5ML PO SUSP
15.0000 mL | ORAL | Status: DC | PRN
  Administered 2012-03-28: 15 mL via ORAL
  Filled 2012-03-28: qty 30

## 2012-03-28 MED ORDER — POTASSIUM CHLORIDE CRYS ER 20 MEQ PO TBCR
20.0000 meq | EXTENDED_RELEASE_TABLET | Freq: Every day | ORAL | Status: AC
  Administered 2012-03-28: 20 meq via ORAL
  Administered 2012-03-29: 40 meq via ORAL
  Administered 2012-03-30: 20 meq via ORAL
  Filled 2012-03-28 (×2): qty 1
  Filled 2012-03-28: qty 2
  Filled 2012-03-28: qty 1

## 2012-03-28 MED ORDER — BISACODYL 10 MG RE SUPP: 10.0000 mg | Freq: Every day | RECTAL | Status: DC | PRN

## 2012-03-28 MED ORDER — SODIUM CHLORIDE 0.9 % IJ SOLN
3.0000 mL | Freq: Two times a day (BID) | INTRAMUSCULAR | Status: DC
  Administered 2012-03-28 – 2012-03-30 (×5): 3 mL via INTRAVENOUS

## 2012-03-28 MED ORDER — ONDANSETRON HCL 4 MG PO TABS: 4.0000 mg | ORAL_TABLET | Freq: Four times a day (QID) | ORAL | Status: DC | PRN

## 2012-03-28 MED ORDER — FUROSEMIDE 40 MG PO TABS
40.0000 mg | ORAL_TABLET | Freq: Every day | ORAL | Status: AC
  Administered 2012-03-29 – 2012-03-30 (×2): 40 mg via ORAL
  Filled 2012-03-28 (×2): qty 1

## 2012-03-28 MED ORDER — FUROSEMIDE 10 MG/ML IJ SOLN
40.0000 mg | Freq: Once | INTRAMUSCULAR | Status: AC
  Administered 2012-03-28: 40 mg via INTRAVENOUS
  Filled 2012-03-28: qty 4

## 2012-03-28 MED ORDER — POTASSIUM CHLORIDE 10 MEQ/50ML IV SOLN
10.0000 meq | INTRAVENOUS | Status: AC | PRN
  Administered 2012-03-28 (×3): 10 meq via INTRAVENOUS

## 2012-03-28 MED ORDER — DOCUSATE SODIUM 100 MG PO CAPS
200.0000 mg | ORAL_CAPSULE | Freq: Every day | ORAL | Status: DC
  Administered 2012-03-28 – 2012-03-31 (×4): 200 mg via ORAL
  Filled 2012-03-28 (×4): qty 2

## 2012-03-28 MED FILL — Mannitol IV Soln 20%: INTRAVENOUS | Qty: 500 | Status: AC

## 2012-03-28 MED FILL — Heparin Sodium (Porcine) Inj 1000 Unit/ML: INTRAMUSCULAR | Qty: 30 | Status: AC

## 2012-03-28 MED FILL — Heparin Sodium (Porcine) Inj 1000 Unit/ML: INTRAMUSCULAR | Qty: 10 | Status: AC

## 2012-03-28 MED FILL — Sodium Bicarbonate IV Soln 8.4%: INTRAVENOUS | Qty: 50 | Status: AC

## 2012-03-28 MED FILL — Electrolyte-R (PH 7.4) Solution: INTRAVENOUS | Qty: 4000 | Status: AC

## 2012-03-28 MED FILL — Sodium Chloride IV Soln 0.9%: INTRAVENOUS | Qty: 1000 | Status: AC

## 2012-03-28 MED FILL — Sodium Chloride Irrigation Soln 0.9%: Qty: 3000 | Status: AC

## 2012-03-28 MED FILL — Lidocaine HCl IV Inj 20 MG/ML: INTRAVENOUS | Qty: 5 | Status: AC

## 2012-03-28 NOTE — Progress Notes (Signed)
Patient ID: Jon Mcgrath, male   DOB: 26-Feb-1941, 71 y.o.   MRN: [id number]                   [address]            [place] [address]          [phone]     2 Days Post-Op Procedure(s) (LRB): CORONARY ARTERY BYPASS GRAFTING (CABG) (N/A) ENDOVEIN HARVEST OF GREATER SAPHENOUS VEIN (N/A)  Total Length of Stay:  LOS: 4 days  BP 115/79  Pulse 91  Temp 98.6 F (37 C) (Oral)  Resp 20  Ht 5\' 10"  (1.778 m)  Wt 211 lb 6.7 oz (95.9 kg)  BMI 30.34 kg/m2  SpO2 94%     . sodium chloride Stopped (03/27/12 0900)  . sodium chloride Stopped (03/26/12 1459)  . sodium chloride    . amiodarone (NEXTERONE PREMIX) 360 mg/200 mL dextrose 60 mg/hr (03/27/12 2200)   Followed by  . amiodarone (NEXTERONE PREMIX) 360 mg/200 mL dextrose Stopped (03/28/12 1100)  . dexmedetomidine (PRECEDEX) IV infusion Stopped (03/26/12 1700)  . lactated ringers 20 mL/hr at 03/28/12 1100  . nitroGLYCERIN Stopped (03/26/12 1506)  . phenylephrine (NEO-SYNEPHRINE) Adult infusion Stopped (03/26/12 2100)     Lab Results  Component Value Date   WBC 11.5* 03/28/2012   HGB 9.9* 03/28/2012   HCT 30.2* 03/28/2012   PLT 114* 03/28/2012   GLUCOSE 141* 03/28/2012   ALT 14 03/27/2012   AST 14 03/27/2012   NA 137 03/28/2012   K 3.6 03/28/2012   CL 105 03/28/2012   CREATININE 1.18 03/28/2012   BUN 20 03/28/2012   CO2 25 03/28/2012   TSH 1.407 03/27/2012   INR 1.31 03/26/2012   Waiting for step down bed  Delight Ovens MD  Beeper [phone] Office [phone] 03/28/2012 5:51 PM

## 2012-03-28 NOTE — Progress Notes (Signed)
Potassium on a.m. Labs was 3.6.  Potassium protocol initiated.  I will continue to monitor.

## 2012-03-28 NOTE — Progress Notes (Signed)
Patient ID: Jon Mcgrath, male   DOB: Apr 05, 1941, 71 y.o.   MRN: [id number] TCTS DAILY PROGRESS NOTE                   [address]            [place] [address]          [phone]      2 Days Post-Op Procedure(s) (LRB): CORONARY ARTERY BYPASS GRAFTING (CABG) (N/A) ENDOVEIN HARVEST OF GREATER SAPHENOUS VEIN (N/A)  Total Length of Stay:  LOS: 4 days   Subjective: Now back in sinus  Objective: Vital signs in last 24 hours: Temp:  [97.5 F (36.4 C)-98.6 F (37 C)] 97.5 F (36.4 C) (06/26 0818) Pulse Rate:  [80-117] 82  (06/26 0800) Cardiac Rhythm:  [-] Normal sinus rhythm (06/26 0800) Resp:  [13-28] 21  (06/26 0800) BP: (82-135)/(55-82) 118/64 mmHg (06/26 0800) SpO2:  [87 %-97 %] 91 % (06/26 0800) Arterial Line BP: (136-156)/(62-70) 156/70 mmHg (06/25 1030) Weight:  [211 lb 6.7 oz (95.9 kg)] 211 lb 6.7 oz (95.9 kg) (06/26 0400)  Filed Weights   03/26/12 0435 03/27/12 0600 03/28/12 0400  Weight: 197 lb 12.8 oz (89.721 kg) 212 lb 1.3 oz (96.2 kg) 211 lb 6.7 oz (95.9 kg)    Weight change: -10.6 oz (-0.3 kg)   Hemodynamic parameters for last 24 hours:    Intake/Output from previous day: 06/25 0701 - 06/26 0700 In: 1832.2 [P.O.:750; I.V.:882.2; IV Piggyback:200] Out: 850 [Urine:820; Chest Tube:30]  Intake/Output this shift: Total I/O In: 36.7 [I.V.:36.7] Out: 100 [Urine:100]  Current Meds: Scheduled Meds:   . acetaminophen  1,000 mg Oral Q6H   Or  . acetaminophen (TYLENOL) oral liquid 160 mg/5 mL  975 mg Per Tube Q6H  . amiodarone  150 mg Intravenous Once  . amiodarone  200 mg Oral Q12H   Followed by  . amiodarone  200 mg Oral Daily  . aspirin EC  325 mg Oral Daily   Or  . aspirin  324 mg Per Tube Daily  . bisacodyl  10 mg Oral Daily   Or  . bisacodyl  10 mg Rectal Daily  . cefUROXime (ZINACEF)  IV  1.5 g Intravenous Q12H  . docusate sodium  200 mg Oral Daily  . insulin aspart  0-24 Units Subcutaneous Q4H  . metoprolol tartrate  12.5  mg Oral BID   Or  . metoprolol tartrate  12.5 mg Per Tube BID  . pantoprazole  40 mg Oral Q1200  . potassium chloride      . sodium chloride  3 mL Intravenous Q12H   Continuous Infusions:   . sodium chloride Stopped (03/27/12 0900)  . sodium chloride Stopped (03/26/12 1459)  . sodium chloride    . amiodarone (NEXTERONE PREMIX) 360 mg/200 mL dextrose 60 mg/hr (03/27/12 2200)   Followed by  . amiodarone (NEXTERONE PREMIX) 360 mg/200 mL dextrose 30 mg/hr (03/28/12 0800)  . dexmedetomidine (PRECEDEX) IV infusion Stopped (03/26/12 1700)  . lactated ringers 20 mL/hr at 03/28/12 0800  . nitroGLYCERIN Stopped (03/26/12 1506)  . phenylephrine (NEO-SYNEPHRINE) Adult infusion Stopped (03/26/12 2100)   PRN Meds:.albumin human, metoprolol, midazolam, morphine injection, ondansetron (ZOFRAN) IV, oxyCODONE, potassium chloride, sodium chloride  General appearance: alert, cooperative and no distress Neurologic: intact Heart: regular rate and rhythm, S1, S2 normal, no murmur, click, rub or gallop and normal apical impulse Lungs: clear to auscultation bilaterally and normal percussion bilaterally Abdomen: soft, non-tender; bowel sounds normal; no masses,  no organomegaly Extremities: extremities normal, atraumatic, no cyanosis or edema and Homans sign is negative, no sign of DVT Wound: stable  Lab Results: CBC: Basename 03/28/12 0413 03/27/12 1646 03/27/12 1645  WBC 11.5* -- 11.9*  HGB 9.9* 9.9* --  HCT 30.2* 29.0* --  PLT 114* -- 109*   BMET:  Basename 03/28/12 0413 03/27/12 1646 03/27/12 0345  NA 137 141 --  K 3.6 4.0 --  CL 105 105 --  CO2 25 -- 22  GLUCOSE 141* 122* --  BUN 20 16 --  CREATININE 1.18 1.10 --  CALCIUM 7.7* -- 7.8*    PT/INR:  Basename 03/26/12 1445  LABPROT 16.5*  INR 1.31   Radiology: Dg Chest Portable 1 View In Am  03/28/2012  *RADIOLOGY REPORT*  Clinical Data: Postop (CABG)  PORTABLE CHEST - 1 VIEW  Comparison: 03/27/2012; 03/26/2012  Findings:  Grossly  unchanged enlarged cardiac silhouette and mediastinal contours post median sternotomy and CABG.  Interval removal of a right jugular approach PA catheter with remaining vascular sheath tip projecting over the mid SVC.  Interval removal of mediastinal drains and left sided chest tube.  No definite pneumothorax.  Decreased lung volumes with worsening basilar opacities.  Small bilateral effusions. Pulmonary vasculature it is indistinct with mild cephalization of flow.  Unchanged bones.  IMPRESSION: 1.  Interval removal of support apparatus.  No pneumothorax. 2.  Decreased lung volumes with worsening basilar opacities, favor represent atelectasis. 3.  Findings suggestive of mild pulmonary edema and small bilateral effusions.  Original Report Authenticated By: Waynard Reeds, M.D.   Dg Chest Portable 1 View In Am  03/27/2012  *RADIOLOGY REPORT*  Clinical Data: Post CABG  PORTABLE CHEST - 1 VIEW  Comparison: Portable exam 0649 hours compared to 03/26/2012  Findings: Endotracheal and nasogastric tubes removed. Mediastinal drain, left thoracostomy tube, and right jugular Swan- Ganz catheter unchanged. Upper normal heart size post CABG. Rotation to the right. Left lower lobe atelectasis. Minimal peribronchial thickening. No pneumothorax.  IMPRESSION: Left lower lobe atelectasis.  Original Report Authenticated By: Lollie Marrow, M.D.   Dg Chest Portable 1 View  03/26/2012  *RADIOLOGY REPORT*  Clinical Data: Postop CABG.  PORTABLE CHEST - 1 VIEW  Comparison: None.  Findings: Changes of prior CABG.  Swan-Ganz catheter tip is in the main pulmonary artery.  Endotracheal tube is 3 cm above the carina. Left chest tube in place without pneumothorax.  NG tube is in the stomach.  Left base atelectasis.  Mild vascular congestion and borderline heart size.  IMPRESSION: Postoperative changes.  Support devices in expected position.  No pneumothorax.  Left base atelectasis, mild vascular congestion.  Original Report Authenticated  By: Cyndie Chime, M.D.     Assessment/Plan: S/P Procedure(s) (LRB): CORONARY ARTERY BYPASS GRAFTING (CABG) (N/A) ENDOVEIN HARVEST OF GREATER SAPHENOUS VEIN (N/A) Mobilize Diuresis d/c tubes/lines Plan for transfer to step-down: see transfer orders     Delight Ovens MD  Beeper [phone] Office [phone] 03/28/2012 8:19 AM

## 2012-03-29 ENCOUNTER — Inpatient Hospital Stay (HOSPITAL_COMMUNITY): Payer: Medicare Other

## 2012-03-29 LAB — GLUCOSE, CAPILLARY
Glucose-Capillary: 103 mg/dL — ABNORMAL HIGH (ref 70–99)
Glucose-Capillary: 105 mg/dL — ABNORMAL HIGH (ref 70–99)
Glucose-Capillary: 116 mg/dL — ABNORMAL HIGH (ref 70–99)
Glucose-Capillary: 121 mg/dL — ABNORMAL HIGH (ref 70–99)
Glucose-Capillary: 86 mg/dL (ref 70–99)
Glucose-Capillary: 87 mg/dL (ref 70–99)
Glucose-Capillary: 96 mg/dL (ref 70–99)

## 2012-03-29 LAB — CBC
HCT: 31.4 % — ABNORMAL LOW (ref 39.0–52.0)
Hemoglobin: 10.3 g/dL — ABNORMAL LOW (ref 13.0–17.0)
MCH: 29.4 pg (ref 26.0–34.0)
MCHC: 32.8 g/dL (ref 30.0–36.0)
MCV: 89.7 fL (ref 78.0–100.0)
Platelets: 148 10*3/uL — ABNORMAL LOW (ref 150–400)
RBC: 3.5 MIL/uL — ABNORMAL LOW (ref 4.22–5.81)
RDW: 15.3 % (ref 11.5–15.5)
WBC: 13.1 10*3/uL — ABNORMAL HIGH (ref 4.0–10.5)

## 2012-03-29 MED ORDER — AMIODARONE HCL 200 MG PO TABS
400.0000 mg | ORAL_TABLET | Freq: Two times a day (BID) | ORAL | Status: DC
  Administered 2012-03-29 – 2012-03-31 (×5): 400 mg via ORAL
  Filled 2012-03-29 (×6): qty 2

## 2012-03-29 MED ORDER — POTASSIUM CHLORIDE CRYS ER 20 MEQ PO TBCR
40.0000 meq | EXTENDED_RELEASE_TABLET | Freq: Once | ORAL | Status: AC
  Administered 2012-03-29: 40 meq via ORAL

## 2012-03-29 MED FILL — Magnesium Sulfate Inj 50%: INTRAMUSCULAR | Qty: 10 | Status: AC

## 2012-03-29 MED FILL — Potassium Chloride Inj 2 mEq/ML: INTRAVENOUS | Qty: 40 | Status: AC

## 2012-03-29 NOTE — Progress Notes (Signed)
3 Days Post-Op Procedure(s) (LRB): CORONARY ARTERY BYPASS GRAFTING (CABG) (N/A) ENDOVEIN HARVEST OF GREATER SAPHENOUS VEIN (N/A)  Subjective: Patient feeling dizzy this am.  Objective: Vital signs in last 24 hours: Patient Vitals for the past 24 hrs:  BP Temp Temp src Pulse Resp SpO2 Weight  03/29/12 0639 110/49 mmHg - - 127  - - -  03/29/12 0524 109/43 mmHg 98.2 F (36.8 C) Oral 84  19  92 % 211 lb 3.2 oz (95.8 kg)  03/28/12 1950 114/69 mmHg 98 F (36.7 C) Oral 75  20  94 % -  03/28/12 1801 123/82 mmHg 97.8 F (36.6 C) Oral 112  20  92 % -  03/28/12 1700 115/79 mmHg - - - 20  94 % -  03/28/12 1642 - 98.6 F (37 C) Oral - - - -  03/28/12 1600 125/69 mmHg - - - 19  96 % -  03/28/12 1500 116/69 mmHg - - - 13  94 % -  03/28/12 1400 118/77 mmHg - - - 24  99 % -  03/28/12 1300 118/77 mmHg - - - 27  95 % -  03/28/12 1200 127/72 mmHg - - - 26  94 % -  03/28/12 1134 - 97.7 F (36.5 C) Oral - - - -  03/28/12 1100 130/77 mmHg - - - 27  97 % -  03/28/12 1000 129/82 mmHg - - 91  32  94 % -  03/28/12 0900 123/63 mmHg - - 91  22  93 % -  03/28/12 0818 - 97.5 F (36.4 C) Oral - - - -  03/28/12 0800 118/64 mmHg - - 82  21  91 % -   Pre op weight  89.4 kg Current Weight  03/29/12 211 lb 3.2 oz (95.8 kg)      Intake/Output from previous day: 06/26 0701 - 06/27 0700 In: 580.1 [P.O.:340; I.V.:136.1; IV Piggyback:104] Out: 1650 [Urine:1650]   Physical Exam:  Cardiovascular: RRR, no murmurs, gallops, or rubs. Pulmonary: Decreased at bases  bilaterally; no rales, wheezes, or rhonchi. Abdomen: Soft, non tender, bowel sounds present. Extremities: Mild bilateral lower extremity edema. Wounds: Clean and dry.  No erythema or signs of infection. Neurologic:Grossly intact without focal deficits  Lab Results: CBC: Basename 03/29/12 0434 03/28/12 0413  WBC 13.1* 11.5*  HGB 10.3* 9.9*  HCT 31.4* 30.2*  PLT 148* 114*   BMET:  Basename 03/28/12 0413 03/27/12 1646 03/27/12 0345  NA 137  141 --  K 3.6 4.0 --  CL 105 105 --  CO2 25 -- 22  GLUCOSE 141* 122* --  BUN 20 16 --  CREATININE 1.18 1.10 --  CALCIUM 7.7* -- 7.8*      Assessment/Plan:  1. CV - Went into afib with RVR after midnight.Converted to SR around 7:30 am.Continue Amiodarone 400 bid and will increase Lopressor to 25 bid when bp tolerates. 2.  Pulmonary - Encourage incentive spirometer and flutter valve.CXR this am shows patient rotated slightly to the right, no ptx, bibasilar atelectasis. 3. Volume Overload - Continue with diuresis. 4.  Acute blood loss anemia - H and H this am increased to 10.3 and 31.4. 5.WBC slightly increased from 11.5 to 13.1.Remains afebrile and no sign of wound infection. Possible secondary to SIRS.re check in am. 6.Thrombocytopenia-Platelet increased to 148,000. 7.CBGs have been less than 140. No prior history of diabetes.Will check a HGA1C and if within normal limits, will discontinue accuchecks. 8.Supplement potassium. 9.If dizziness continues, will check orthostatics  Nelle Sayed MPA-C 03/29/2012

## 2012-03-29 NOTE — Progress Notes (Addendum)
Pt ambulated 300 feet in hallway with RN, walker, O2; pt steady on his feet but very SOB on exertion; pt assisted back to chair and encouraged to ambulate one more time today; will cont. To monitor.

## 2012-03-29 NOTE — Progress Notes (Signed)
Patient HR continues to be elevated in the 130 to 140's  while at rest and with activity. Pt has been in and out of Afib, ordered chest x-ray was postponed this AM due to HR in the 150's. Dr Tyrone Sage made aware, new orders received,  See flow sheet, portable Chest Xray ordered, will continue to monitor.

## 2012-03-29 NOTE — Progress Notes (Signed)
CARDIAC REHAB PHASE I   PRE:  Rate/Rhythm: 88 SR  BP:  Supine: 121/62  Sitting: 111/73  Standing: 113/65   SaO2: 90-92 2L  MODE:  Ambulation: 350 ft   POST:  Rate/Rhythem: 114 ST  BP:  Supine:   Sitting: 124/66  Standing:     SaO2: 96-98 2L 0940-1015 Assisted X 2 used walker and O2 2L to ambulate. Took orthostatic BP prior to walk, pt denies any dizziness with sitting or standing. Gait steady with walker. Pt tires easily and is SOB with exertion. Pt exhausted by end of walk to recliner after walk with call light in reach. Encouraged use of IS and more walks today.   Jon Mcgrath

## 2012-03-29 NOTE — Progress Notes (Addendum)
attemtped to wean O2; O2 sats RA 88% at this time; O2 2L reapplied; will cont. To monitor.

## 2012-03-30 ENCOUNTER — Encounter (HOSPITAL_COMMUNITY): Payer: Self-pay | Admitting: Cardiothoracic Surgery

## 2012-03-30 DIAGNOSIS — Z951 Presence of aortocoronary bypass graft: Secondary | ICD-10-CM

## 2012-03-30 LAB — CBC
HCT: 29.3 % — ABNORMAL LOW (ref 39.0–52.0)
MCHC: 34.1 g/dL (ref 30.0–36.0)
MCV: 90.4 fL (ref 78.0–100.0)
RDW: 15.2 % (ref 11.5–15.5)

## 2012-03-30 LAB — GLUCOSE, CAPILLARY
Glucose-Capillary: 100 mg/dL — ABNORMAL HIGH (ref 70–99)
Glucose-Capillary: 104 mg/dL — ABNORMAL HIGH (ref 70–99)
Glucose-Capillary: 112 mg/dL — ABNORMAL HIGH (ref 70–99)
Glucose-Capillary: 98 mg/dL (ref 70–99)

## 2012-03-30 MED ORDER — AMIODARONE HCL 400 MG PO TABS: 400.0000 mg | ORAL_TABLET | Freq: Two times a day (BID) | ORAL | Status: DC

## 2012-03-30 MED ORDER — METOPROLOL TARTRATE 12.5 MG HALF TABLET: 12.5000 mg | ORAL_TABLET | Freq: Two times a day (BID) | ORAL | Status: DC

## 2012-03-30 MED ORDER — POTASSIUM CHLORIDE CRYS ER 20 MEQ PO TBCR: 20.0000 meq | EXTENDED_RELEASE_TABLET | Freq: Every day | ORAL | Status: DC

## 2012-03-30 MED ORDER — TRAMADOL HCL 50 MG PO TABS: 50.0000 mg | ORAL_TABLET | ORAL | Status: DC | PRN

## 2012-03-30 MED ORDER — OXYCODONE HCL 5 MG PO TABS: 5.0000 mg | ORAL_TABLET | ORAL | Status: DC | PRN

## 2012-03-30 MED ORDER — LACTULOSE 10 GM/15ML PO SOLN
20.0000 g | Freq: Once | ORAL | Status: AC
  Administered 2012-03-30: 20 g via ORAL
  Filled 2012-03-30: qty 30

## 2012-03-30 MED ORDER — FUROSEMIDE 40 MG PO TABS: 40.0000 mg | ORAL_TABLET | Freq: Every day | ORAL | Status: DC

## 2012-03-30 NOTE — Discharge Summary (Signed)
Physician Discharge Summary  Patient ID: Jon Mcgrath MRN: [id number] DOB/AGE: [birth year] 71 y.o.  Admit date: 03/24/2012 Discharge date: 03/30/2012  Admission Diagnoses:  Patient Active Problem List  Diagnosis  . S/P angioplasty with stent  . HTN (hypertension)  . GERD (gastroesophageal reflux disease)  . NSTEMI (non-ST elevated myocardial infarction)  . AAA (abdominal aortic aneurysm)   Discharge Diagnoses:   Patient Active Problem List  Diagnosis  . S/P angioplasty with stent  . HTN (hypertension)  . GERD (gastroesophageal reflux disease)  . NSTEMI (non-ST elevated myocardial infarction)  . AAA (abdominal aortic aneurysm)  . S/P CABG x 5   Discharged Condition: good  History of Present Illness:   Jon Mcgrath is a 71 yo male with known CAD S/P PCI with stent placement to his LAD performed at [facility] in 1997. The patient presented to [facility] on 03/23/2012 with a new complaint of chest pain. The patient reported his chest pain had started around 8:00am, which woke him up from sleep. The pain was continuous and felt more like pressure with radiation into his back. The pain was associated with shortness of breath. The patient denied any nausea, vomiting or diaphoresis. The patient stated this pain was similar to the pain he experienced in 1997. EKG obtained revealed Q waves in lead II, III, and aVf. His cardiac enzymes revealed an elevated Troponin of 2.08. The patient also admitted to a new diagnosis of an abdominal aneurysm which CT scan was obtained to rule out acute dissection, but ct report says no evidence of aneurysm. The patient was admitted and Cardiology consult was obtained. The patient was taken to the catheterization lab and found to have multivessel CAD.  It was felt the patient would benefit from coronary bypass procedure.  Dr. Lady Gary spoke with Dr. Tyrone Sage and the patient was transferred to [facility] for possible coronary bypass.  Hospital Course:    Upon arrival the patient was chest pain free.  His cardiac catheretization films were reviewed and his preoperative workup was complete.  He was admitted and set up for coronary bypass to be performed Monday morning 03/26/2012.  The patient remained chest pain free prior to surgery.  He was taken to the operating room and underwent CABG x5 utilizing LIMA to LAD, Sequential SVG to OM1-OM2, and Sequential SVG to RCA, distal Circumflex.  He also underwent endoscopic saphenous vein harvest of the right leg. The patient tolerated the procedure well and was taken to the SICU in stable condition.  POD #0 the patient was extubated.  POD #1  The patients chest tubes and arterial lines were removed without difficulty.  The patient developed Atrial Fibrillation and was treated with IV Amiodarone accordingly.  POD #2 patient converted to NSR.  His chest xray did not show any evidence of pneumothorax.  He was medically stable and awaiting transfer to step down bed. POD #3 patient developed atrial fibrillation overnight.  He converted to NSR this morning.  POD #4 the patient is maintaining NSR.  He is progressing well.  If he continues to progress and we can wean patient off his oxygen we will plan for discharge in the next 24-48 hours.  He will follow up at [facility] on 04/23/2012 with a chest xray prior to his appointment.  He will also need to follow up with Dr. Lady Gary in 2 weeks time.  Treatments: surgery:  PREOPERATIVE DIAGNOSIS: Recent non-STEMI myocardial infarction with three-vessel coronary artery occlusive disease.  POSTOPERATIVE DIAGNOSIS: Recent non-STEMI myocardial infarction  with three-vessel coronary artery occlusive disease.   SURGICAL PROCEDURE: Coronary artery bypass grafting x5 with the left internal mammary to the left anterior descending coronary artery,sequential reverse saphenous vein graft to the first and second obtuse marginal's, sequential reverse saphenous vein graft to the distal right  coronary  artery, and distal circumflex with right thigh and calf EndoVein harvesting.  Disposition: Home  Discharge Orders    Future Appointments: Provider: Department: Dept Phone: Center:   04/23/2012 1:00 PM [phone] [facility]     Medication List  As of 03/30/2012 10:29 AM   Medication List  As of 03/31/2012  9:22 AM   STOP taking these medications         lisinopril-hydrochlorothiazide 20-12.5 MG per tablet         TAKE these medications         amiodarone 400 MG tablet   Commonly known as: PACERONE   Take 1 tablet (400 mg total) by mouth 2 (two) times daily. For one week then take Amiodarone 400 mg po daily thereafter.      atorvastatin 20 MG tablet   Commonly known as: LIPITOR   Take 1 tablet (20 mg total) by mouth daily at 6 PM.      furosemide 40 MG tablet   Commonly known as: LASIX   Take 1 tablet (40 mg total) by mouth daily. For 5 days then stop.      metoprolol tartrate 25 MG tablet   Commonly known as: LOPRESSOR   Take 0.5 tablets (12.5 mg total) by mouth 2 (two) times daily.      omeprazole 20 MG capsule   Commonly known as: PRILOSEC   Take 20 mg by mouth daily.      potassium chloride SA 20 MEQ tablet   Commonly known as: K-DUR,KLOR-CON   Take 1 tablet (20 mEq total) by mouth daily. For 5 days then stop      traMADol 50 MG tablet   Commonly known as: ULTRAM   Take 1-2 tablets (50-100 mg total) by mouth every 4 (four) hours as needed for pain.          Please note he was not discharged on an ACE or ARB as blood pressure would not tolerate.Consider starting as an outpatient as si s/p NSTEMI.  Follow-up Information    Follow up with [facility] on 04/23/2012. (appointment is at 1:00pm)    Contact information:   [address] [place], [address] [phone]       Follow up with [facility] on 04/23/2012. (Please get chest xray at 12:00pm)    Contact information:   [address], [address]      Follow up with  [name]., MD. Schedule an appointment as soon as possible for a visit in 2 weeks.   Contact information:   [address] [place] [address] [phone]          Signed: Lowella Dandy 03/30/2012, 10:29 AM

## 2012-03-30 NOTE — Progress Notes (Signed)
Pt ambulated 450 feet in hallway with rolling walker and RN assist; pt ambulated without difficulty on RA; will cont. To monitor.

## 2012-03-30 NOTE — Progress Notes (Signed)
Pt ambulated 550 feet in hallway with rolling walker and assist X1; pt stopped twice for rest break; pt assisted back to bed once in room; O2 sats 93% RA; wife in room; will cont. To monitor.

## 2012-03-30 NOTE — Progress Notes (Addendum)
4 Days Post-Op Procedure(s) (LRB): CORONARY ARTERY BYPASS GRAFTING (CABG) (N/A) ENDOVEIN HARVEST OF GREATER SAPHENOUS VEIN (N/A)  Subjective: Patient   Objective: Vital signs in last 24 hours: Patient Vitals for the past 24 hrs:  BP Temp Temp src Pulse Resp SpO2 Weight  03/30/12 0631 - - - - - 91 % -  03/30/12 0622 110/69 mmHg 97.9 F (36.6 C) Oral 88  18  89 % 210 lb 8.6 oz (95.5 kg)  03/29/12 2138 125/73 mmHg 98.1 F (36.7 C) Oral 99  18  96 % -  03/29/12 1611 - - - - - 92 % -  03/29/12 1426 - - - - - 88 % -  03/29/12 1413 - - - - - 100 % -  03/29/12 1355 116/77 mmHg 98.2 F (36.8 C) Oral 97  18  93 % -  03/29/12 0754 98/60 mmHg - - 95  18  93 % -   Pre op weight  89.4 kg Current Weight  03/30/12 210 lb 8.6 oz (95.5 kg)      Intake/Output from previous day: 06/27 0701 - 06/28 0700 In: -  Out: 1250 [Urine:1250]   Physical Exam:  Cardiovascular: RRR, no murmurs, gallops, or rubs. Pulmonary: Decreased at bases  bilaterally; no rales, wheezes, or rhonchi. Abdomen: Soft, non tender, bowel sounds present. Extremities: Mild bilateral lower extremity edema. Wounds: Clean and dry.  No erythema or signs of infection. Neurologic:Grossly intact without focal deficits  Lab Results: CBC:  Basename 03/30/12 0610 03/29/12 0434  WBC 9.5 13.1*  HGB 10.0* 10.3*  HCT 29.3* 31.4*  PLT 187 148*   BMET:   Basename 03/28/12 0413 03/27/12 1646  NA 137 141  K 3.6 4.0  CL 105 105  CO2 25 --  GLUCOSE 141* 122*  BUN 20 16  CREATININE 1.18 1.10  CALCIUM 7.7* --      Assessment/Plan:  1. CV - Went into afib with RVR after midnight 6/27.Converted to SR around 7:30 am 6/27.Continue Amiodarone 400 bid and  Lopressor to 12.5 bid. 2.  Pulmonary - Encourage incentive spirometer and flutter valve. 3. Volume Overload - Continue with diuresis. 4.  Acute blood loss anemia - H and H this am increased to 10.0 and 29.3. 5.WBC slightly decreased from 13.1 to 9.5.Remains afebrile and no  sign of wound infection. 6.Thrombocytopenia-Resolved as platelest increased to 187,000. 7.CBGs have been less than 140. No prior history of diabetes.HGA1C 5.9 so will stop accuchecks. 8.LOC constipation 9.Remove EPW in am. 10.Possible discharge Sunday or Monday (if off O2)  ZIMMERMAN,DONIELLE MPA-C 03/30/2012   poss home sat or Sunday Walking well today  Currently off o2 I have seen and examined Jon Mcgrath and agree with the above assessment  and plan.  Delight Ovens MD Beeper [phone] Office [phone] 03/30/2012 1:39 PM

## 2012-03-30 NOTE — Discharge Instructions (Signed)
Discharge Instructions  1. Diet- Heart Healthy 2. No Driving for 4 weeks or while using narcotic pain medication 3. No heaving lifting (>8lbs) or strenuous activity 4. Wash wounds daily with soap and water, pat dry  Coronary Artery Bypass Grafting Care After Refer to this sheet in the next few weeks. These instructions provide you with information on caring for yourself after your procedure. Your caregiver may also give you more specific instructions. Your treatment has been planned according to current medical practices, but problems sometimes occur. Call your caregiver if you have any problems or questions after your procedure.  Recovery from open heart surgery will be different for everyone. Some people feel well after 3 or 4 weeks, while for others it takes longer. After heart surgery, it may be normal to:  Not have an appetite, feel nauseated by the smell of food, or only want to eat a small amount.   Be constipated because of changes in your diet, activity, and medicines. Eat foods high in fiber. Add fresh fruits and vegetables to your diet. Stool softeners may be helpful.   Feel sad or unhappy. You may be frustrated or cranky. You may have good days and bad days. Do not give up. Talk to your caregiver if you do not feel better.   Feel weakness and fatigue. You many need physical therapy or cardiac rehabilitation to get your strength back.   Develop an irregular heartbeat called atrial fibrillation. Symptoms of atrial fibrillation are a fast, irregular heartbeat or feelings of fluttery heartbeats, shortness of breath, low blood pressure, and dizziness. If these symptoms develop, see your caregiver right away.  MEDICATION  Have a list of all the medicines you will be taking when you leave the hospital. For every medicine, know the following:   Name.   Exact dose.   Time of day to be taken.   How often it should be taken.   Why you are taking it.   Ask which medicines should or  should not be taken together. If you take more than one heart medicine, ask if it is okay to take them together. Some heart medicines should not be taken at the same time because they may lower your blood pressure too much.   Narcotic pain medicine can cause constipation. Eat fresh fruits and vegetables. Add fiber to your diet. Stool softener medicine may help relieve constipation.   Keep a copy of your medicines with you at all times.   Do not add or stop taking any medicine until you check with your caregiver.   Medicines can have side effects. Call your caregiver who prescribed the medicine if you:   Start throwing up, have diarrhea, or have stomach pain.   Feel dizzy or lightheaded when you stand up.   Feel your heart is skipping beats or is beating too fast or too slow.   Develop a rash.   Notice unusual bruising or bleeding.  HOME CARE INSTRUCTIONS  After heart surgery, it is important to learn how to take your pulse. Have your caregiver show you how to take your pulse.   Use your incentive spirometer. Ask your caregiver how long after surgery you need to use it.  Care of your chest incision  Tell your caregiver right away if you notice clicking in your chest (sternum).   Support your chest with a pillow or your arms when you take deep breaths and cough.   Follow your caregiver's instructions about when you can bathe or  swim.   Protect your incision from sunlight during the first year to keep the scar from getting dark.   Tell your caregiver if you notice:   Increased tenderness of your incision.   Increased redness or swelling around your incision.   Drainage or pus from your incision.  Care of your leg incision(s)  Avoid crossing your legs.   Avoid sitting for long periods of time. Change positions every half hour.   Elevate your leg(s) when you are sitting.   Check your leg(s) daily for swelling. Check the incisions for redness or drainage.   Wear your  elastic stockings as told by your caregiver. Take them off at bedtime.  Diet  Diet is very important to heart health.   Eat plenty of fresh fruits and vegetables. Meats should be lean cut. Avoid canned, processed, and fried foods.   Talk to a dietician. They can teach you how to make healthy food and drink choices.  Weight  Weigh yourself every day. This is important because it helps to know if you are retaining fluid that may make your heart and lungs work harder.   Use the same scale each time.   Weigh yourself every morning at the same time. You should do this after you go to the bathroom, but before you eat breakfast.   Your weight will be more accurate if you do not wear any clothes.   Record your weight.   Tell your caregiver if you have gained 2 pounds or more overnight.  Activity Stop any activity at once if you have chest pain, shortness of breath, irregular heartbeats, or dizziness. Get help right away if you have any of these symptoms.  Bathing.  Avoid soaking in a bath or hot tub until your incisions are healed.   Rest. You need a balance of rest and activity.   Exercise. Exercise per your caregiver's advice. You may need physical therapy or cardiac rehabilitation to help strengthen your muscles and build your endurance.   Climbing stairs. Unless your caregiver tells you not to climb stairs, go up stairs slowly and rest if you tire. Do not pull yourself up by the handrail.   Driving a car. Follow your caregiver's advice on when you may drive. You may ride as a passenger at any time. When traveling for long periods of time in a car, get out of the car and walk around for a few minutes every 2 hours.   Lifting. Avoid lifting, pushing, or pulling anything heavier than 10 pounds for 6 weeks after surgery or as told by your caregiver.   Returning to work. Check with your caregiver. People heal at different rates. Most people will be able to go back to work 6 to 12 weeks  after surgery.   Sexual activity. You may resume sexual relations as told by your caregiver.  SEEK MEDICAL CARE IF:  Any of your incisions are red, painful, or have any type of drainage coming from them.   You have an oral temperature above 102 F (38.9 C).   You have ankle or leg swelling.   You have pain in your legs.   You have weight gain of 2 or more pounds a day.   You feel dizzy or lightheaded when you stand up.  SEEK IMMEDIATE MEDICAL CARE IF:  You have angina or chest pain that goes to your jaw or arms. Call your local emergency services right away.   You have shortness of breath at rest  or with activity.   You have a fast or irregular heartbeat (arrhythmia).   There is a "clicking" in your sternum when you move.   You have numbness or weakness in your arms or legs.  MAKE SURE YOU:  Understand these instructions.   Will watch your condition.   Will get help right away if you are not doing well or get worse.  Document Released: 04/08/2005 Document Revised: 09/08/2011 Document Reviewed: 11/24/2010 [facility] Patient Information 2012 [place], [place].  Endoscopic Saphenous Vein Harvesting Care After Refer to this sheet in the next few weeks. These instructions provide you with information on caring for yourself after your procedure. Your caregiver may also give you more specific instructions. Your treatment has been planned according to current medical practices, but problems sometimes occur. Call your caregiver if you have any problems or questions after your procedure. HOME CARE INSTRUCTIONS Medicine  Take whatever pain medicine your surgeon prescribes. Follow the directions carefully. Do not take over-the-counter pain medicine unless your surgeon says it is okay. Some pain medicine can cause bleeding problems for several weeks after surgery.   Follow your surgeon's instructions about driving. You will probably not be permitted to drive after heart surgery.   Take  any medicines your surgeon prescribes. Any medicines you took before your heart surgery should be checked with your caregiver before you start taking them again.  Wound care  Ask your surgeon how long you should keep wearing your elastic bandage or stocking.   Check the area around your surgical cuts (incisions) whenever your bandages (dressings) are changed. Look for any redness or swelling.   You will need to return to have the stitches (sutures) or staples taken out. Ask your surgeon when to do that.   Ask your surgeon when you can shower or bathe.  Activity  Try to keep your legs raised when you are sitting.   Do any exercises your caregivers have given you. These may include deep breathing exercises, coughing, walking, or other exercises.  SEEK MEDICAL CARE IF:  You have any questions about your medicines.   You have more leg pain, especially if your pain medicine stops working.   New or growing bruises develop on your leg.   Your leg swells, feels tight, or becomes red.   You have numbness in your leg.  SEEK IMMEDIATE MEDICAL CARE IF:  Your pain gets much worse.   Blood or fluid leaks from any of the incisions.   Your incisions become warm, swollen, or red.   You have chest pain.   You have trouble breathing.   You have a fever.   You have more pain near your leg incision.  MAKE SURE YOU:  Understand these instructions.   Will watch your condition.   Will get help right away if you are not doing well or get worse.  Document Released: 06/01/2011 Document Revised: 09/08/2011 Document Reviewed: 06/01/2011 [facility] Patient Information 2012 [place], [place].

## 2012-03-30 NOTE — Progress Notes (Signed)
Pt. Ambulated 150 ft with a walker and one assist on O2. No complaints, or SOB. Will continue to monitor.

## 2012-03-30 NOTE — Progress Notes (Signed)
CARDIAC REHAB PHASE I   PRE:  Rate/Rhythm: 88SR  BP:  Supine:   Sitting: 124/64  Standing:    SaO2: 93%2L, 91%RA  MODE:  Ambulation: 550 ft   POST:  Rate/Rhythem: 115ST  BP:  Supine:   Sitting: 132/59  Standing:    SaO2: 93%RA, checked multiple times during walk and always 93-94% [id number] Pt walked 550 ft on RA with rolling walker and asst x 1. Took several standing rest stops. Checked sats with each rest stop and sats at 93-94%. Tolerated well. To recliner after walk. Encouraged IS and flutter valve. Left off oxygen and notified RN. Pt would like rolling walker for home use. Wife has standard walker that does not have wheels. No dizziness.  Duanne Limerick

## 2012-03-31 MED ORDER — TRAMADOL HCL 50 MG PO TABS: 50.0000 mg | ORAL_TABLET | ORAL | Status: AC | PRN

## 2012-03-31 MED ORDER — ATORVASTATIN CALCIUM 20 MG PO TABS
20.0000 mg | ORAL_TABLET | Freq: Every day | ORAL | Status: DC
  Filled 2012-03-31: qty 1

## 2012-03-31 MED ORDER — FUROSEMIDE 40 MG PO TABS: 40.0000 mg | ORAL_TABLET | Freq: Every day | ORAL | Status: DC

## 2012-03-31 MED ORDER — ATORVASTATIN CALCIUM 20 MG PO TABS: 20.0000 mg | ORAL_TABLET | Freq: Every day | ORAL | Status: DC

## 2012-03-31 MED ORDER — POTASSIUM CHLORIDE CRYS ER 20 MEQ PO TBCR: 20.0000 meq | EXTENDED_RELEASE_TABLET | Freq: Every day | ORAL | Status: DC

## 2012-03-31 MED ORDER — AMIODARONE HCL 400 MG PO TABS: 400.0000 mg | ORAL_TABLET | Freq: Two times a day (BID) | ORAL | Status: DC

## 2012-03-31 MED ORDER — METOPROLOL TARTRATE 25 MG PO TABS: 12.5000 mg | ORAL_TABLET | Freq: Two times a day (BID) | ORAL | Status: DC

## 2012-03-31 NOTE — Progress Notes (Signed)
Patient ambulated 571ft  with RW x1 assist. Two rest breaks, good steady pace, no complaints, no O2. Deaisha Welborn, Chrystine Oiler

## 2012-03-31 NOTE — Progress Notes (Addendum)
5 Days Post-Op Procedure(s) (LRB): CORONARY ARTERY BYPASS GRAFTING (CABG) (N/A) ENDOVEIN HARVEST OF GREATER SAPHENOUS VEIN (N/A)  Subjective: Patient was a little "sick on his stomach" prior to breakfast. Feels better now. Denies abdominal pain, nausea, or emesis.  Objective: Vital signs in last 24 hours: Patient Vitals for the past 24 hrs:  BP Temp Temp src Pulse Resp SpO2  03/31/12 0442 114/58 mmHg 97.2 F (36.2 C) Oral 83  19  93 %  03/30/12 2136 128/68 mmHg 98.3 F (36.8 C) Oral 99  20  94 %  03/30/12 1838 - - - - - 93 %  03/30/12 1418 - - - - - 93 %  03/30/12 1351 118/73 mmHg 96.9 F (36.1 C) Oral 89  20  91 %  03/30/12 1042 132/59 mmHg - - 98  - -   Pre op weight  89.4 kg Current Weight  03/30/12 210 lb 8.6 oz (95.5 kg)      Intake/Output from previous day: 06/28 0701 - 06/29 0700 In: 720 [P.O.:720] Out: 2325 [Urine:2325]   Physical Exam:  Cardiovascular: RRR, no murmurs, gallops, or rubs. Pulmonary: Slightly decreased at bases bilaterally; no rales, wheezes, or rhonchi. Abdomen: Soft, non tender, bowel sounds present. Extremities: Mild bilateral lower extremity edema. Wounds: Clean and dry.  No erythema or signs of infection.   Lab Results: CBC:  Basename 03/30/12 0610 03/29/12 0434  WBC 9.5 13.1*  HGB 10.0* 10.3*  HCT 29.3* 31.4*  PLT 187 148*   BMET:  No results found for this basename: NA:2,K:2,CL:2,CO2:2,GLUCOSE:2,BUN:2,CREATININE:2,CALCIUM:2 in the last 72 hours   Assessment/Plan:  1. CV - s/p NSTEMI.Went into afib with RVR after midnight 6/27.Converted to SR around 7:30 am 6/27.Maintaining SR since then.Continue Amiodarone 400 bid and  Lopressor to 12.5 bid. ACE or ARB as an outpatient when BP tolerates. Also, patient is not on a statin. Will start Lipitor. 2.  Pulmonary - Encourage incentive spirometer and flutter valve. 3. Volume Overload - Continue with diuresis. 4.  Acute blood loss anemia - Last H and H stable at 10 and 29.3. 5.Remove  EPW. 6.Possible discharge later today.  Shyheim Tanney MPA-C 03/31/2012 8:39 AM

## 2012-03-31 NOTE — Progress Notes (Signed)
Ed completed with wife present. Voiced understanding and requests his name be sent to [facility]. [phone] Ethelda Chick CES, ACSM

## 2012-03-31 NOTE — Progress Notes (Signed)
EPW and CTS discontinued per protocol. Tips intact. Patient tolerated well. Last INR 1.31  Patient advised Bedrest X 1 hour. Jon Mcgrath, Chrystine Oiler

## 2012-03-31 NOTE — Progress Notes (Addendum)
   CARE MANAGEMENT NOTE 03/31/2012  Patient:  Jon Mcgrath, Jon Mcgrath   Account Number:  [id number]  Date Initiated:  03/27/2012  Documentation initiated by:  AMERSON,JULIE  Subjective/Objective Assessment:   PT S/P CABG X5 ON 03/26/12.  PTA, PT INDEPENDENT, LIVES WITH SPOUSE.     Action/Plan:   MET WITH PT AND FAMILY TO DISCUSS DC PLANS.  WIFE AND SON TO PROVIDE 24HR CARE AT DISCHARGE.  WILL FOLLOW FOR HOME NEEDS AS PT PROGRESSES.   Anticipated DC Date:  03/31/2012   Anticipated DC Plan:  HOME W HOME HEALTH SERVICES      DC Planning Services  CM consult      [facility]  HOME HEALTH   Choice offered to / List presented to:  C-1 Patient   DME arranged  [facility]  3-N-1      DME agency  [facility]     [facility] arranged  HH-1 RN      [facility] agency  [facility]   Status of service:  Completed, signed off Medicare Important Message given?   (If response is "NO", the following Medicare IM given date fields will be blank) Date Medicare IM given:   Date Additional Medicare IM given:    Discharge Disposition:  HOME W HOME HEALTH SERVICES  Per UR Regulation:    If discussed at Long Length of Stay Meetings, dates discussed:    Comments:  late entry 03/22/2012 1030 Pt gave permission to speak with wife. Wife inquired about [facility] RN. Spoke to pt and provided list of HH agencies in his county. Gave pt information on agency that will accept his insurance. States he is familiar with [facility] and [facility] explained that they will accept his insurance. Contacted [facility] for [facility] RN and 3n1 for scheduled d/c today. Has RW in room that was delivered by [facility]. Isidoro Donning RN CCM Case Mgmt phone [phone]  03/30/12 JULIE AMERSON,RN,BSN [phone] 1030AM PT REQUESTS RW FOR HOME.  REFERRAL TO [facility] FOR DME NEEDS.

## 2012-03-31 NOTE — Progress Notes (Signed)
Pt. Discharged 03/31/2012  3:11 PM Discharge instructions reviewed with patient/family. Patient/family verbalized understanding. All Rx's given. Questions answered as needed. Pt. Discharged to home with family. Shaka Cardin, Chrystine Oiler

## 2012-04-04 ENCOUNTER — Telehealth: Payer: Self-pay | Admitting: *Deleted

## 2012-04-04 DIAGNOSIS — B999 Unspecified infectious disease: Secondary | ICD-10-CM

## 2012-04-04 MED ORDER — CEPHALEXIN 500 MG PO CAPS: 500.0000 mg | ORAL_CAPSULE | Freq: Three times a day (TID) | ORAL | Status: AC

## 2012-04-04 NOTE — Telephone Encounter (Signed)
Daughter calls to say that the end of her Dad's bypass sternal incision is a little red, angry looking, and she can see yellow "stuff" ?pus. She says the home health nurse is coming to see her Dad on Friday.  I said that an antibiotic will be prescribed and the nurse can evaluate the incision Friday.  At that time we will determine if he needs to be seen in the office.  She agrees.  He is afebrile at present.

## 2012-04-19 ENCOUNTER — Other Ambulatory Visit: Payer: Self-pay | Admitting: Cardiothoracic Surgery

## 2012-04-19 DIAGNOSIS — I2510 Atherosclerotic heart disease of native coronary artery without angina pectoris: Secondary | ICD-10-CM

## 2012-04-23 ENCOUNTER — Ambulatory Visit (INDEPENDENT_AMBULATORY_CARE_PROVIDER_SITE_OTHER): Payer: Self-pay | Admitting: Physician Assistant

## 2012-04-23 ENCOUNTER — Ambulatory Visit
Admission: RE | Admit: 2012-04-23 | Discharge: 2012-04-23 | Disposition: A | Discharge status: Home / Self Care | Payer: Medicare Other | Source: Ambulatory Visit | Attending: Cardiothoracic Surgery | Admitting: Cardiothoracic Surgery

## 2012-04-23 VITALS — BP 143/84 | HR 58 | Temp 97.1°F | Resp 16 | Ht 70.0 in | Wt 201.0 lb

## 2012-04-23 DIAGNOSIS — I2510 Atherosclerotic heart disease of native coronary artery without angina pectoris: Secondary | ICD-10-CM

## 2012-04-23 DIAGNOSIS — Z951 Presence of aortocoronary bypass graft: Secondary | ICD-10-CM

## 2012-04-23 NOTE — Progress Notes (Signed)
  HPI: Patient returns for routine postoperative follow-up having undergone CABG x5 on June 24,2013 The patient's early postoperative recovery while in the hospital was notable for Atrial Fibrillation. Since hospital discharge the patient reports he has been doing well.  He did contact our office about 2 weeks ago with a complaint of sternal drainage from his incision.  The drainage was clear but he was provided with an antibiotic with resolution of symptoms.  He denies chest pain and shortness of breath.  He is able to ambulate without difficulty and his appetite is back to normal.  He does have some diarrhea, which he thinks is related to one of his new medications.  He is no longer taking stool softeners.     Current Outpatient Prescriptions  Medication Sig Dispense Refill  . amiodarone (PACERONE) 400 MG tablet Take 1 tablet (400 mg total) by mouth 2 (two) times daily. For one week then take Amiodarone 400 mg po daily thereafter.  60 tablet  1  . atorvastatin (LIPITOR) 20 MG tablet Take 1 tablet (20 mg total) by mouth daily at 6 PM.  30 tablet  1  . metoprolol tartrate (LOPRESSOR) 25 MG tablet Take 0.5 tablets (12.5 mg total) by mouth 2 (two) times daily.  30 tablet  1  . omeprazole (PRILOSEC) 20 MG capsule Take 20 mg by mouth daily.      . traMADol (ULTRAM) 50 MG tablet Take 50 mg by mouth every 6 (six) hours as needed.      . furosemide (LASIX) 40 MG tablet Take 1 tablet (40 mg total) by mouth daily. For 5 days then stop.  5 tablet  0  . potassium chloride SA (K-DUR,KLOR-CON) 20 MEQ tablet Take 1 tablet (20 mEq total) by mouth daily. For 5 days then stop  5 tablet  0    Physical Exam:  BP 143/84  Pulse 58  Temp 97.1 F (36.2 C)  Resp 16  Ht 5\' 10"  (1.778 m)  Wt 201 lb (91.173 kg)  BMI 28.84 kg/m2  SpO2 96%  Gen: no apparent distress Lung: CTA bilaterally Heart: RRR, sternum stable Abd: soft, non-tender, non-distended, bowel sounds are normal Ext: some minor edema of LLE Skin:  incisions healing well, no further drainage present along sternotomy  Diagnostic Tests:  CXR: sternal wires intact, no pleural effusions presents, no pneumothorax.  Impression:  Mr. Jon Mcgrath is S/P CABG x5 doing well.  He continues to progress without difficulty.  He is NSR per auscultation and remains on Amiodarone which will be adjusted per Cardiology.  He was encouraged to continue walking as much as he tolerates.  He was also instructed he can began driving short distances, as long as he is not taking narcotic pain medication.  He was again educated regarding sternal precautions for a few more weeks.  Plan:  Continue current treatment regimen.  Patient is to follow up with Cardiologist tomorrow, who can address Amiodarone therapy.  We will bring him back to clinic in one month to follow up with Dr. Tyrone Sage.  Patient was instructed to contact the office, should problems arise prior to his appointment

## 2012-05-24 ENCOUNTER — Ambulatory Visit (INDEPENDENT_AMBULATORY_CARE_PROVIDER_SITE_OTHER): Payer: Self-pay | Admitting: Cardiothoracic Surgery

## 2012-05-24 ENCOUNTER — Encounter: Payer: Self-pay | Admitting: Cardiothoracic Surgery

## 2012-05-24 VITALS — BP 165/75 | HR 48 | Resp 16 | Ht 69.0 in | Wt 200.0 lb

## 2012-05-24 DIAGNOSIS — Z951 Presence of aortocoronary bypass graft: Secondary | ICD-10-CM

## 2012-05-24 DIAGNOSIS — I2510 Atherosclerotic heart disease of native coronary artery without angina pectoris: Secondary | ICD-10-CM

## 2012-05-24 NOTE — Progress Notes (Signed)
[address]            [place] [address]          [phone]       Jon Mcgrath [facility] Medical Record #[id number] Date of Birth: 1940/11/05  Jon Heading, MD No primary provider on file.  Chief Complaint:   PostOp Follow Up Visit 03/26/2012  DATE OF DISCHARGE:  OPERATIVE REPORT  PREOPERATIVE DIAGNOSIS: Recent non-STEMI myocardial infarction with  three-vessel coronary artery occlusive disease.  POSTOPERATIVE DIAGNOSIS: Recent non-STEMI myocardial infarction with  three-vessel coronary artery occlusive disease.  SURGICAL PROCEDURE: Coronary artery bypass grafting x5 with the left  internal mammary to the left anterior descending coronary artery,  sequential reverse saphenous vein graft to the first and second obtuse  marginals, sequential reverse saphenous vein graft to the distal right  coronary artery, and distal circumflex with right thigh and calf  EndoVein harvesting.  History of Present Illness:      Doing well post op , no chest pain or sob with exercise No pedal edema        History  Smoking status  . Former Smoker  . Quit date: 10/03/2005  Smokeless tobacco  . Not on file       No Known Allergies  Current Outpatient Prescriptions  Medication Sig Dispense Refill  . amiodarone (PACERONE) 400 MG tablet Take 1 tablet (400 mg total) by mouth 2 (two) times daily. For one week then take Amiodarone 400 mg po daily thereafter.  60 tablet  1  . atorvastatin (LIPITOR) 20 MG tablet Take 1 tablet (20 mg total) by mouth daily at 6 PM.  30 tablet  1  . metoprolol tartrate (LOPRESSOR) 25 MG tablet Take 0.5 tablets (12.5 mg total) by mouth 2 (two) times daily.  30 tablet  1  . omeprazole (PRILOSEC) 20 MG capsule Take 20 mg by mouth daily.      . traMADol (ULTRAM) 50 MG tablet Take 50 mg by mouth every 6 (six) hours as needed.       Patient says he is not taking Pacerone any more    Physical Exam: BP 165/75  Pulse  48  Resp 16  Ht 5\' 9"  (1.753 m)  Wt 200 lb (90.719 kg)  BMI 29.53 kg/m2  SpO2 98%  General appearance: alert and cooperative Neurologic: intact Heart: regular rate and rhythm, S1, S2 normal, no murmur, click, rub or gallop and normal apical impulse Lungs: clear to auscultation bilaterally Abdomen: soft, non-tender; bowel sounds normal; no masses,  no organomegaly Extremities: extremities normal, atraumatic, no cyanosis or edema, Homans sign is negative, no sign of DVT and well healed rt endo vein harvest Wound: sternum stable and well healed Wounds:  Diagnostic Studies & Laboratory data:         Recent Radiology Findings: No results found.    Recent Labs: Lab Results  Component Value Date   WBC 9.5 03/30/2012   HGB 10.0* 03/30/2012   HCT 29.3* 03/30/2012   PLT 187 03/30/2012   GLUCOSE 141* 03/28/2012   ALT 14 03/27/2012   AST 14 03/27/2012   NA 137 03/28/2012   K 3.6 03/28/2012   CL 105 03/28/2012   CREATININE 1.18 03/28/2012   BUN 20 03/28/2012   CO2 25 03/28/2012   TSH 1.407 03/27/2012   INR 1.31 03/26/2012   HGBA1C 5.9* 03/29/2012  Assessment / Plan:      Stable post op doing well Cost too much to go to cardiac rehab per patient Will see back as needed  Patient will check meds , says he is not taking [name] MD 05/24/2012 10:07 AM

## 2012-09-13 ENCOUNTER — Ambulatory Visit: Payer: Self-pay | Admitting: Internal Medicine

## 2012-11-21 IMAGING — CR DG CHEST 2V
2 series · 2 of 2 positions shown · non-contrast
Comparison: 03/29/2012

CLINICAL DATA: 3-week follow up status post CABG

CHEST - 2 VIEW

[w chest pa]
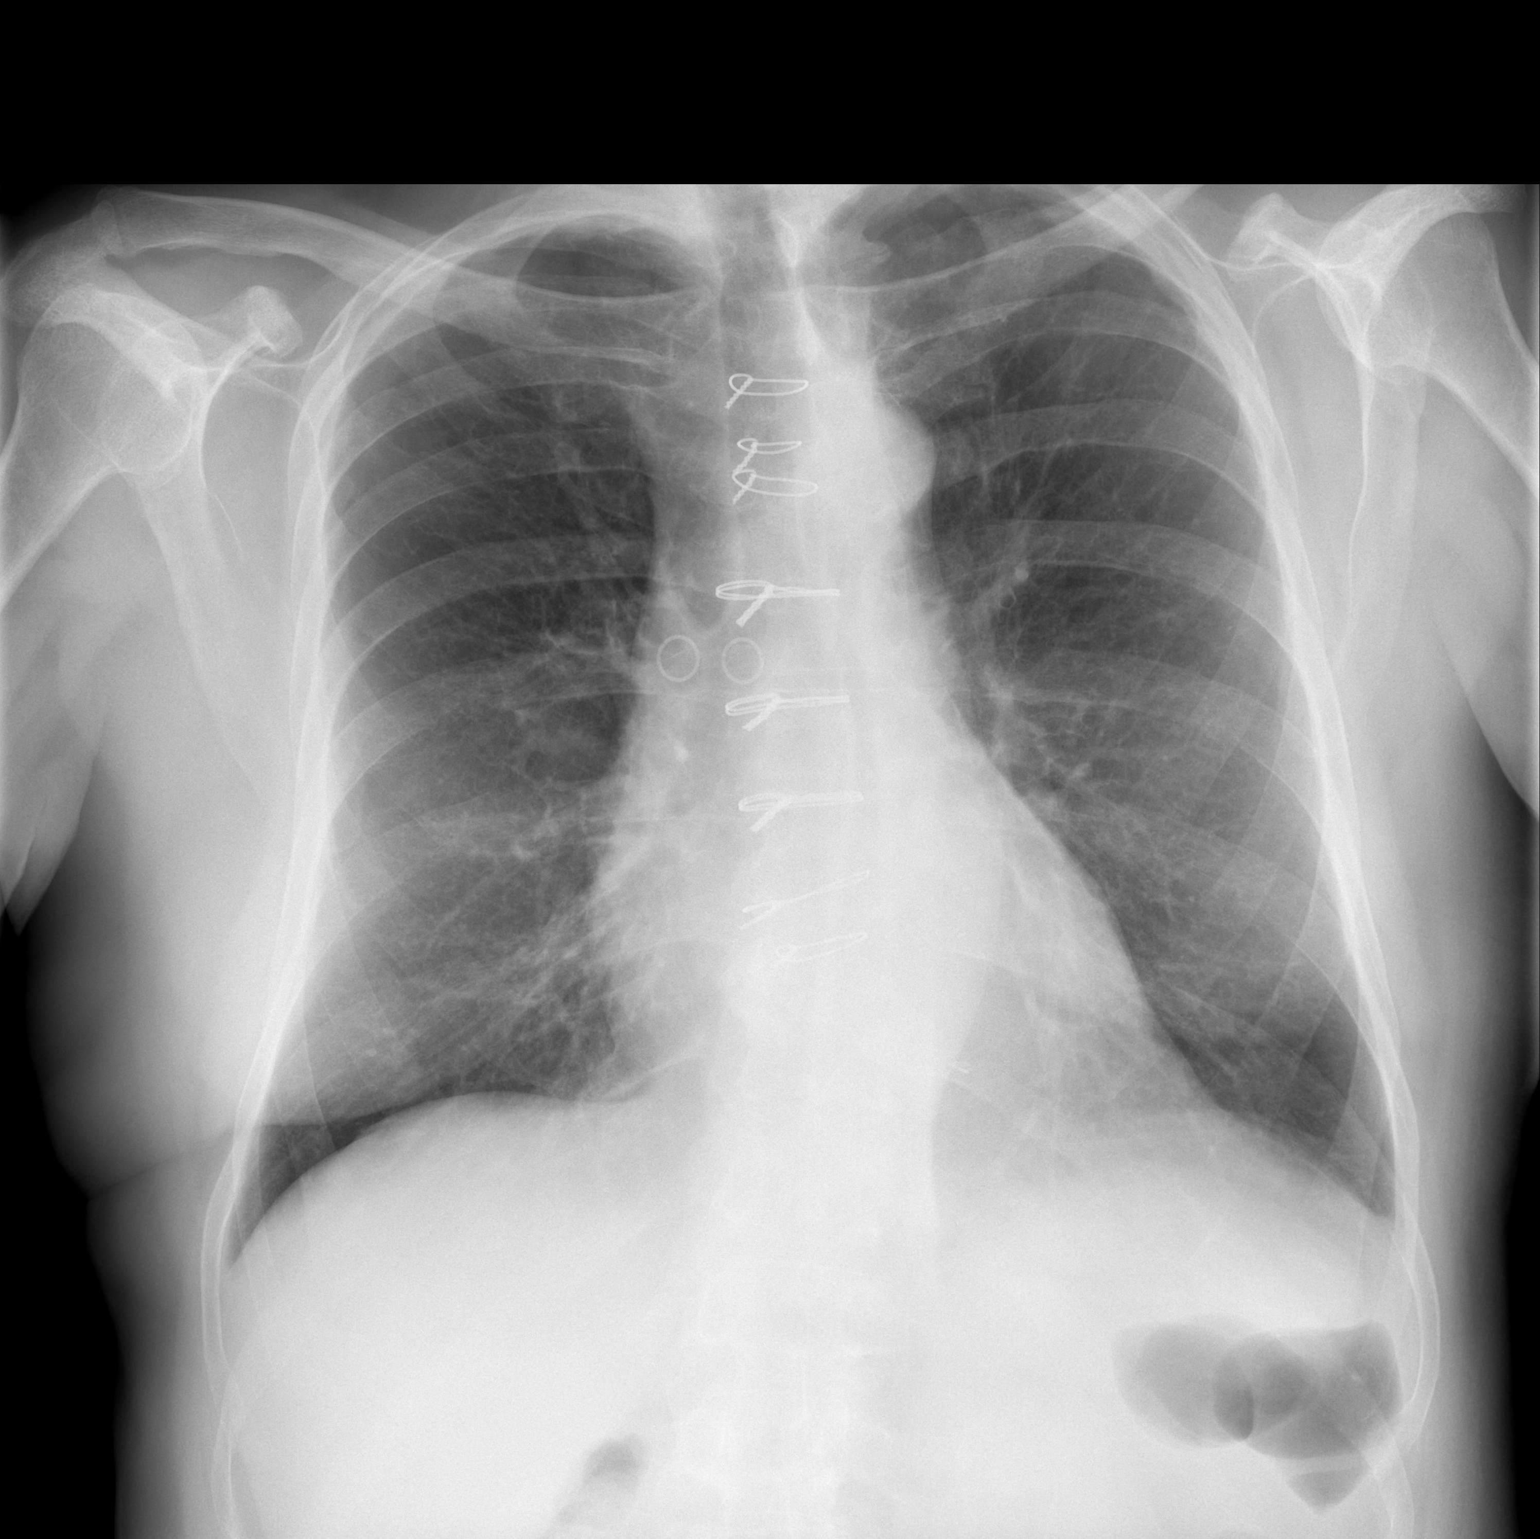

[w chest lat]
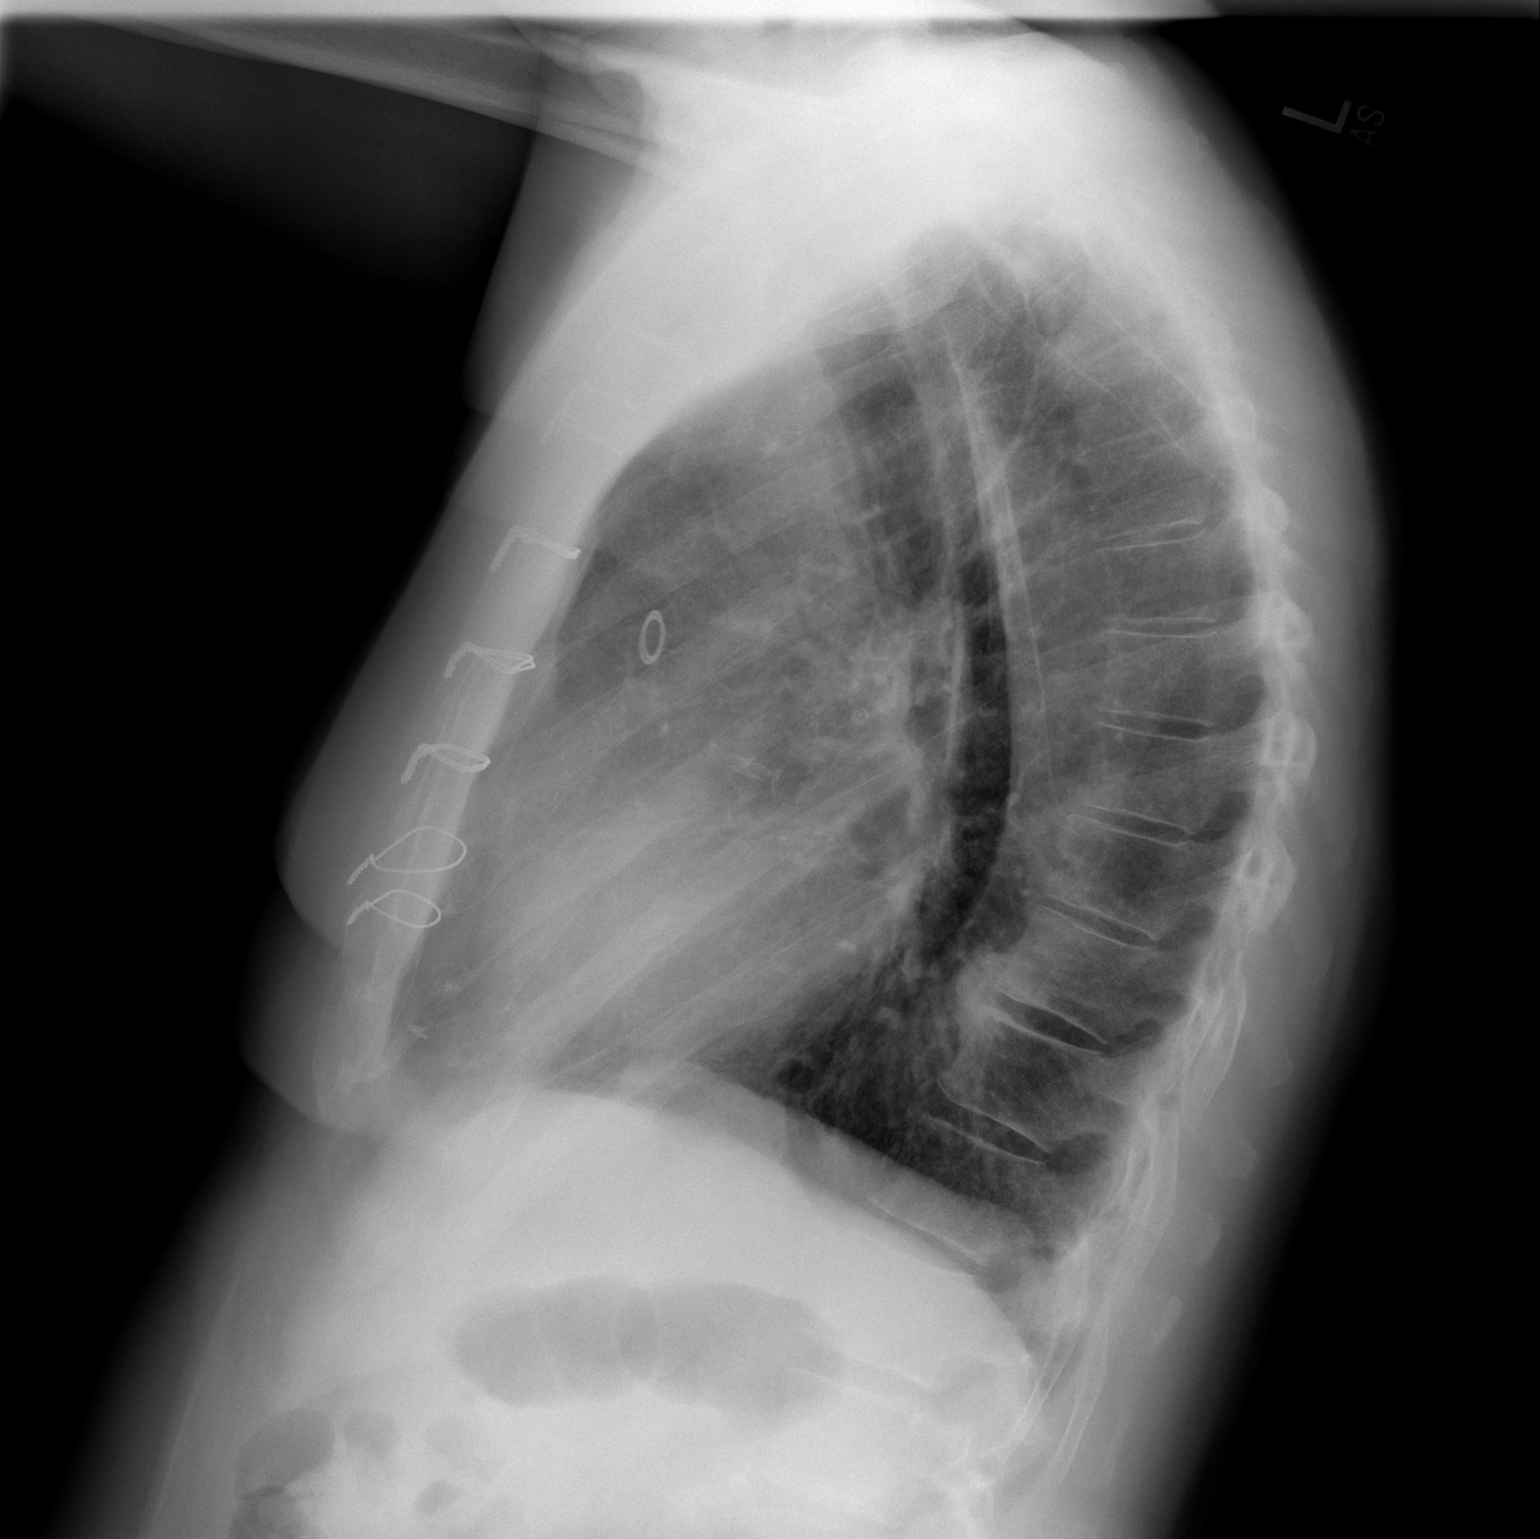

[2 of 2 positions shown; findings below may reference images not displayed]

FINDINGS: Lungs are essentially clear.  No pleural effusion or
pneumothorax.

Cardiomediastinal silhouette is within normal limits. Postsurgical
changes related to prior CABG.

Degenerative changes of the visualized thoracolumbar spine with
osteophytic spurring at T9-10, best demonstrated on the lateral
view.
IMPRESSION: No evidence of acute cardiopulmonary disease.

## 2013-01-08 ENCOUNTER — Ambulatory Visit: Payer: Self-pay | Admitting: Unknown Physician Specialty

## 2013-01-09 LAB — PATHOLOGY REPORT

## 2013-01-30 ENCOUNTER — Ambulatory Visit: Payer: Self-pay | Admitting: Internal Medicine

## 2013-12-23 DIAGNOSIS — I25810 Atherosclerosis of coronary artery bypass graft(s) without angina pectoris: Secondary | ICD-10-CM | POA: Insufficient documentation

## 2014-03-23 DIAGNOSIS — N1832 Chronic kidney disease, stage 3b: Secondary | ICD-10-CM | POA: Insufficient documentation

## 2015-01-25 NOTE — Discharge Summary (Signed)
PATIENT NAME:  Jon Mcgrath, Jon Mcgrath MR#:  [id number] DATE OF BIRTH:  Sep 14, 1941  DATE OF ADMISSION:  03/23/2012 DATE OF DISCHARGE:  03/24/2012  DISCHARGE DIAGNOSES: 1. Non-ST elevated myocardial infarction, cardiac catheterization showing triple vessel disease.  2. Hypertension.  3. History of peptic ulcer disease.   CHIEF COMPLAINT: Chest pain.   HISTORY OF PRESENT ILLNESS: Jon Mcgrath is a 74 year old male with history of coronary artery disease status post stent placement in 1997 at [facility] who presented to the Emergency Room complaining of chest pain. He stated the pain started around 8 o'clock  a.m. and woke him up from sleep. It was continuous. He described it as a pressure like sensation radiating to the back associated with shortness of breath. The patient denied any nausea, vomiting, or diaphoresis.   PAST MEDICAL HISTORY:  1. Coronary artery disease with stent in 1997. 2. History of peptic ulcer disease.  3. Hypertension. 4. Previous right hernia repair.   PHYSICAL EXAMINATION: On examination temperature was 97.7, pulse 70, respirations 18, blood pressure 157/88, and oxygen saturation was 98% on 2 liters nasal cannula. He was not in distress. Neck was supple, no JVD. HEART with S1 and S2, no rubs or murmurs. Lungs were clear to auscultation bilaterally. Abdomen was soft, nontender. Extremities had no edema.   LABS: Glucose 113, BUN 21, creatinine 1.38, sodium 141, potassium 3.8, chloride 107, and CO2 25. Hematocrit 42.2, hemoglobin 14, and WBC count 11.7. TSH 0.724. Platelets 190. Dimer 0.53. Initial CPK 30.3 and MB 26.9. Troponin 2.08 and repeat troponin 8.13.   HOSPITAL COURSE: The patient was admitted to [facility] and was placed on IV heparin. He was also seen in consultation by Dr. Ubaldo Glassing, cardiologist, and underwent a cardiac catheterization. Angiographic findings showed proximal left main lesion 10% stenosis, ostial LAD lesion tubular 80% stenosis at site of prior stent, S1  lesion 90% stenosis, proximal circumflex lesion 70% stenosis, mid circumflex. 80% stenosis, OM-1 lesion 50% stenosis, OM-3 lesion 95% stenosis, mid RCA lesion 80% stenosis, and distal RCA 100% stenosis. During his stay in the hospital, the patient was essentially managed with pain control and received nitroglycerin paste.Dr. Ubaldo Glassing did arrange for pt to be transferred to  [facility] under  the care of Dr. Darius Bump in stable condition for further management and possible coronary artery bypass graft.  ____________________________ Tracie Harrier, MD vh:slb D: 03/28/2012 19:07:00 ET T: 03/29/2012 15:52:18 ET JOB#: [id number]  cc: Tracie Harrier, MD, <Dictator> Tracie Harrier MD ELECTRONICALLY SIGNED 04/12/2012 17:18

## 2015-01-25 NOTE — Consult Note (Signed)
    General Aspect 74 yo male with history of cad s/p pci in 1997 who has presented to er with comlaints of awakening with chest pain and has ruled in for a nstemi. He states he has done well from cardiac standpoint since his pci. He states the pain was midsternal with radiation to his back and arm. He is essentially pain free at present. He denies syncope or presyncope. EKG shows no injury current.   Physical Exam:   GEN well developed, well nourished, no acute distress    HEENT PERRL    NECK supple    RESP clear BS  no use of accessory muscles    CARD Regular rate and rhythm  No murmur    ABD denies tenderness  no liver/spleen enlargement  normal BS    LYMPH negative neck    EXTR negative cyanosis/clubbing, negative edema    SKIN normal to palpation    NEURO cranial nerves intact, motor/sensory function intact    PSYCH A+O to time, place, person   Review of Systems:   Subjective/Chief Complaint chest pain    General: No Complaints    Skin: No Complaints    ENT: No Complaints    Eyes: No Complaints    Neck: No Complaints    Respiratory: No Complaints    Cardiovascular: Chest pain or discomfort  Tightness    Gastrointestinal: No Complaints    Genitourinary: No Complaints    Vascular: No Complaints    Musculoskeletal: No Complaints    Neurologic: No Complaints    Hematologic: No Complaints    Endocrine: No Complaints    Psychiatric: No Complaints    Review of Systems: All other systems were reviewed and found to be negative    Medications/Allergies Reviewed Medications/Allergies reviewed     MI - Myocardial Infarct:        Admit Diagnosis:   CHEST PAIN: 23-Mar-2012, Active, CHEST PAIN  EKG:   EKG NSR  nml intervals    No Known Allergies:     Impression 74 yo with history of cad s/p pci in 1997 now admitted with chest pain and has ruled in for a nstemi. He is currently hemodynamically stable. EKG does not suggest injury or ischemia. Pain has  improved. COncerned over progession of cad. Given rest pain and abnormal tronponin, will need to proceed with cardiac cath to evaluate anatomy.    Plan 1. Continue with current meds 2. IV heparin, nitrates, beta blockers, asa, statin 3. Proceed with cardiac cath to evaluate anatomy 4. Further recs after cath.   Electronic Signatures: Teodoro Spray (MD)  (Signed 21-Jun-13 08:32)  Authored: General Aspect/Present Illness, History and Physical Exam, Review of System, Past Medical History, Health Issues, Home Medications, EKG , Allergies, Impression/Plan   Last Updated: 21-Jun-13 08:32 by Teodoro Spray (MD)

## 2015-01-25 NOTE — H&P (Signed)
PATIENT NAME:  Jon Mcgrath, Jon Mcgrath MR#:  [id number] DATE OF BIRTH:  Nov 02, 1940  DATE OF ADMISSION:  03/23/2012  PRIMARY CARE PHYSICIAN: Emily Filbert, MD  REFERRING PHYSICIAN: Ferman Hamming, MD  CHIEF COMPLAINT: Chest pain.   HISTORY OF PRESENT ILLNESS: This is a 74 year old male with significant past medical history of coronary artery disease status post a stent at [facility] in 1997 who presents with complaint of chest pain. The patient reports chest pain started at 8:00 a.m. this morning, woke him up from sleep. It was continuous. More pressure type in quality and radiating to the back accompanied by some mild shortness of breath. He denies any nausea or vomiting, diaphoresis or palpitations. The patient reports that his chest pain resembles the chest pain he had in 1997. By reviewing the patient's medical record, he has a nuclear stress test done by Dr. Saralyn Pilar in 2001 which showed evidence of inferior ischemia. The patient has had no chest complaints since 2001. The patient's troponins were positive 2.08. The patient's EKG shows Q waves in lead II, lead III, and aVF. Currently the patient reports his chest pain is much improved, but still present. The patient was given 324 mg of aspirin in the emergency department. The patient reports he was recently diagnosed with abdominal aortic aneurysm. He had an abdominal ultrasound done in October of 2012 where he had a 3.2 cm AAA. The patient had an abdominal ultrasound done to evaluate his AAA, but the study was of poor quality secondary to abdominal gas which we were not able to evaluate for his AAA.   PAST MEDICAL HISTORY:  1. Coronary artery disease with stent in 1997.  2. Peptic ulcer disease.  3. Hypertension.   PAST SURGICAL HISTORY: Right hernia.   SOCIAL HISTORY: The patient quit smoking six years ago. He used to work in a [employer], currently retired. No alcohol or drug abuse.   FAMILY HISTORY: Significant for heart disease as his father died of a  heart attack at age 104.   ALLERGIES: No known drug allergies.   HOME MEDICATIONS:  1. Lisinopril/hydrochlorothiazide 20/12.5 mg half tablet daily.  2. Omeprazole 20 mg 1 capsule daily.  3. Cialis.   REVIEW OF SYSTEMS: CONSTITUTIONAL: Denies any fever, fatigue, weakness, or weight gain. EYES: Denies blurry vision, double vision, or pain. ENT: Denies tinnitus, ear pain, or hearing loss. RESPIRATORY: Denies any cough, wheezing, or hemoptysis. Has mild dyspnea. CARDIOVASCULAR: Denies any worsening edema, arrhythmia, palpitations, or syncope. Has complaints of chest pain. GI: Denies any nausea, vomiting, diarrhea, or abdominal pain. GENITOURINARY: Denies dysuria, hematuria, or renal colic. ENDOCRINE: Denies polyuria, polydipsia, or heat or cold intolerance. HEMATOLOGY: Denies anemia, easy bruising, or history of blood clots. INTEGUMENTARY: Denies any acne or rash. MUSCULOSKELETAL: Denies any neck pain, back pain, swelling, gout, or cramps. NEUROLOGIC: Denies any numbness, weakness, dysarthria, tremors, or vertigo. PSYCH: Denies any anxiety, insomnia, alcohol, or drug abuse.   PHYSICAL EXAMINATION:   VITAL SIGNS: Temperature 97.7, pulse 70, respiratory rate 18, blood pressure 157/88, and saturating 98% on 2 liters nasal cannula.   GENERAL: Well-nourished male who is comfortable in bed, in no apparent distress.   HEENT: Head atraumatic, normocephalic. Pupils equal and reactive to light. Pink conjunctivae. Anicteric sclerae. Moist oral mucosa.   NECK: Supple. No thyromegaly. No JVD.   CHEST: Good air entry bilaterally. No wheezing, rales, or rhonchi.   CARDIOVASCULAR: S1 and S2 heard. No rubs, murmurs, or gallops.   ABDOMEN: Soft, nontender, and nondistended. Bowel sounds present.  EXTREMITIES: No edema. No clubbing or cyanosis.   PSYCHIATRIC: Appropriate affect. Awake and alert x3. Intact judgment and insight.   SKIN: No rash.   NEUROLOGIC: Cranial nerves grossly intact. Motor 5/5 in all  extremities.   PERTINENT LABS/DIAGNOSTICS: Glucose 113, BUN 21, creatinine 1.38, sodium 141, potassium 3.8, chloride 107, CO2 25, and GFR 52, Total CK 303, CK/MB 26.9, and troponin 2.08. TSH 0.724. White blood cells 11.4, hemoglobin 14, hematocrit 42.2, and platelets 190. D-dimer 0.53.  Urinalysis negative for leukocyte esterase and nitrite.   EKG shows sinus tachycardia at a rate of 112 and shows Q wave in lead II, lead III, and aVF. No old EKG to compare.   ASSESSMENT AND PLAN:  1. Chest pain. This is secondary to non-ST elevated myocardial infarction/acute coronary syndrome, but given the patient's recent diagnosis of AAA with pain radiating to the back, we will need to rule out AAA rupture first, so we will have CTA done to evaluate for AAA, and if it comes back negative for any rupture, we will start the patient on IV heparin drip. The patient has already been given 324 mg of aspirin. We will start him on Coreg and statin. We will check lipid panel in the a.m. and we will consult with cardiology service, and due to the fact the patient is on Cialis, his last dose was within two days, we will hold on starting the patient on nitroglycerin.  2. Hypertension. We will start the patient on Coreg and will resume ACE inhibitor and hydrochlorothiazide once the patient is appropriately hydrated given the fact his creatinine is 1.38 today and he received IV contrast for his CTA of the abdominal aorta.  3. Protonix for GI prophylaxis.           CODE STATUS: FULL CODE.   TOTAL TIME SPENT ON PATIENT CARE: 60 minutes.  ____________________________ Albertine Patricia, MD dse:slb D: 03/23/2012 01:56:32 ET T: 03/23/2012 08:54:01 ET JOB#: [id number]  cc: Albertine Patricia, MD, <Dictator> Rusty Aus, MD Timonthy Hovater Graciela Husbands MD ELECTRONICALLY SIGNED 03/24/2012 0:35

## 2015-09-30 ENCOUNTER — Other Ambulatory Visit: Payer: Self-pay | Admitting: Internal Medicine

## 2015-09-30 DIAGNOSIS — I714 Abdominal aortic aneurysm, without rupture, unspecified: Secondary | ICD-10-CM

## 2015-10-01 ENCOUNTER — Ambulatory Visit
Admission: RE | Admit: 2015-10-01 | Discharge: 2015-10-01 | Disposition: A | Discharge status: Home / Self Care | Payer: Medicare Other | Source: Ambulatory Visit | Attending: Internal Medicine | Admitting: Internal Medicine

## 2015-10-01 DIAGNOSIS — I714 Abdominal aortic aneurysm, without rupture, unspecified: Secondary | ICD-10-CM

## 2016-01-26 ENCOUNTER — Encounter: Payer: Self-pay | Admitting: Emergency Medicine

## 2016-01-26 ENCOUNTER — Emergency Department: Payer: Medicare Other

## 2016-01-26 ENCOUNTER — Emergency Department
Admission: EM | Admit: 2016-01-26 | Discharge: 2016-01-26 | Disposition: A | Discharge status: Home / Self Care | Payer: Medicare Other | Attending: Emergency Medicine | Admitting: Emergency Medicine

## 2016-01-26 DIAGNOSIS — I110 Hypertensive heart disease with heart failure: Secondary | ICD-10-CM | POA: Diagnosis not present

## 2016-01-26 DIAGNOSIS — Y999 Unspecified external cause status: Secondary | ICD-10-CM | POA: Diagnosis not present

## 2016-01-26 DIAGNOSIS — Z79899 Other long term (current) drug therapy: Secondary | ICD-10-CM | POA: Diagnosis not present

## 2016-01-26 DIAGNOSIS — E785 Hyperlipidemia, unspecified: Secondary | ICD-10-CM | POA: Insufficient documentation

## 2016-01-26 DIAGNOSIS — S2020XA Contusion of thorax, unspecified, initial encounter: Secondary | ICD-10-CM | POA: Insufficient documentation

## 2016-01-26 DIAGNOSIS — I252 Old myocardial infarction: Secondary | ICD-10-CM | POA: Insufficient documentation

## 2016-01-26 DIAGNOSIS — Z955 Presence of coronary angioplasty implant and graft: Secondary | ICD-10-CM | POA: Insufficient documentation

## 2016-01-26 DIAGNOSIS — S20219A Contusion of unspecified front wall of thorax, initial encounter: Secondary | ICD-10-CM

## 2016-01-26 DIAGNOSIS — Z23 Encounter for immunization: Secondary | ICD-10-CM | POA: Diagnosis not present

## 2016-01-26 DIAGNOSIS — Y9389 Activity, other specified: Secondary | ICD-10-CM | POA: Insufficient documentation

## 2016-01-26 DIAGNOSIS — I509 Heart failure, unspecified: Secondary | ICD-10-CM | POA: Insufficient documentation

## 2016-01-26 DIAGNOSIS — Y92410 Unspecified street and highway as the place of occurrence of the external cause: Secondary | ICD-10-CM | POA: Diagnosis not present

## 2016-01-26 DIAGNOSIS — Z87891 Personal history of nicotine dependence: Secondary | ICD-10-CM | POA: Diagnosis not present

## 2016-01-26 DIAGNOSIS — M542 Cervicalgia: Secondary | ICD-10-CM | POA: Diagnosis present

## 2016-01-26 DIAGNOSIS — S8001XA Contusion of right knee, initial encounter: Secondary | ICD-10-CM | POA: Insufficient documentation

## 2016-01-26 DIAGNOSIS — S81812A Laceration without foreign body, left lower leg, initial encounter: Secondary | ICD-10-CM | POA: Insufficient documentation

## 2016-01-26 HISTORY — DX: Heart failure, unspecified: I50.9

## 2016-01-26 LAB — COMPREHENSIVE METABOLIC PANEL
ALT: 25 U/L (ref 17–63)
ANION GAP: 9 (ref 5–15)
AST: 22 U/L (ref 15–41)
Albumin: 4.5 g/dL (ref 3.5–5.0)
Alkaline Phosphatase: 54 U/L (ref 38–126)
BILIRUBIN TOTAL: 0.5 mg/dL (ref 0.3–1.2)
BUN: 29 mg/dL — ABNORMAL HIGH (ref 6–20)
CHLORIDE: 110 mmol/L (ref 101–111)
CO2: 22 mmol/L (ref 22–32)
Calcium: 9.4 mg/dL (ref 8.9–10.3)
Creatinine, Ser: 1.4 mg/dL — ABNORMAL HIGH (ref 0.61–1.24)
GFR, EST AFRICAN AMERICAN: 56 mL/min — AB (ref >60)
GFR, EST NON AFRICAN AMERICAN: 48 mL/min — AB (ref >60)
Glucose, Bld: 106 mg/dL — ABNORMAL HIGH (ref 65–99)
POTASSIUM: 4.8 mmol/L (ref 3.5–5.1)
Sodium: 141 mmol/L (ref 135–145)
TOTAL PROTEIN: 7.5 g/dL (ref 6.5–8.1)

## 2016-01-26 LAB — CBC WITH DIFFERENTIAL/PLATELET
BASOS ABS: 0.1 10*3/uL (ref 0–0.1)
Basophils Relative: 1 %
EOS PCT: 1 %
Eosinophils Absolute: 0.1 10*3/uL (ref 0–0.7)
HEMATOCRIT: 42.5 % (ref 40.0–52.0)
Hemoglobin: 13.9 g/dL (ref 13.0–18.0)
LYMPHS ABS: 1.3 10*3/uL (ref 1.0–3.6)
LYMPHS PCT: 11 %
MCH: 28.6 pg (ref 26.0–34.0)
MCHC: 32.6 g/dL (ref 32.0–36.0)
MCV: 87.9 fL (ref 80.0–100.0)
MONO ABS: 0.7 10*3/uL (ref 0.2–1.0)
MONOS PCT: 5 %
NEUTROS ABS: 10.5 10*3/uL — AB (ref 1.4–6.5)
Neutrophils Relative %: 82 %
PLATELETS: 172 10*3/uL (ref 150–440)
RBC: 4.84 MIL/uL (ref 4.40–5.90)
RDW: 14.2 % (ref 11.5–14.5)
WBC: 12.7 10*3/uL — ABNORMAL HIGH (ref 3.8–10.6)

## 2016-01-26 LAB — URINALYSIS COMPLETE WITH MICROSCOPIC (ARMC ONLY)
BILIRUBIN URINE: NEGATIVE
Bacteria, UA: NONE SEEN
Glucose, UA: NEGATIVE mg/dL
KETONES UR: NEGATIVE mg/dL
Leukocytes, UA: NEGATIVE
NITRITE: NEGATIVE
PH: 5 (ref 5.0–8.0)
PROTEIN: NEGATIVE mg/dL
SPECIFIC GRAVITY, URINE: 1.012 (ref 1.005–1.030)
Squamous Epithelial / LPF: NONE SEEN
WBC UA: NONE SEEN WBC/hpf (ref 0–5)

## 2016-01-26 MED ORDER — IOPAMIDOL (ISOVUE-300) INJECTION 61%
100.0000 mL | Freq: Once | INTRAVENOUS | Status: AC | PRN
  Administered 2016-01-26: 100 mL via INTRAVENOUS
  Filled 2016-01-26: qty 100

## 2016-01-26 MED ORDER — BACLOFEN 10 MG PO TABS: 10.0000 mg | ORAL_TABLET | Freq: Three times a day (TID) | ORAL | Status: AC

## 2016-01-26 MED ORDER — TETANUS-DIPHTH-ACELL PERTUSSIS 5-2.5-18.5 LF-MCG/0.5 IM SUSP
0.5000 mL | Freq: Once | INTRAMUSCULAR | Status: AC
  Administered 2016-01-26: 0.5 mL via INTRAMUSCULAR
  Filled 2016-01-26: qty 0.5

## 2016-01-26 MED ORDER — LIDOCAINE HCL (PF) 1 % IJ SOLN
INTRAMUSCULAR | Status: AC
  Filled 2016-01-26: qty 5

## 2016-01-26 MED ORDER — BACITRACIN ZINC 500 UNIT/GM EX OINT
TOPICAL_OINTMENT | CUTANEOUS | Status: AC
  Filled 2016-01-26: qty 1.8

## 2016-01-26 NOTE — ED Notes (Signed)
Pt discharged home after verbalizing understanding of discharge instructions; nad noted. 
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
## 2016-01-26 NOTE — ED Provider Notes (Signed)
ED ECG REPORT I, Doran Stabler, the attending physician, personally viewed and interpreted this ECG.   Date: 01/26/2016  EKG Time: 1317  Rate: 65  Rhythm: normal sinus rhythm  Axis: Normal axis  Intervals:none  ST&T Change: No ST segment elevation or depression. No abnormal T-wave inversion.   Orbie Pyo, MD 01/26/16 1320

## 2016-02-18 DIAGNOSIS — E782 Mixed hyperlipidemia: Secondary | ICD-10-CM | POA: Insufficient documentation

## 2016-03-31 DIAGNOSIS — I10 Essential (primary) hypertension: Secondary | ICD-10-CM | POA: Insufficient documentation

## 2017-01-11 ENCOUNTER — Other Ambulatory Visit: Payer: Self-pay | Admitting: Internal Medicine

## 2017-01-11 DIAGNOSIS — I714 Abdominal aortic aneurysm, without rupture, unspecified: Secondary | ICD-10-CM

## 2017-01-11 DIAGNOSIS — D369 Benign neoplasm, unspecified site: Secondary | ICD-10-CM | POA: Insufficient documentation

## 2017-01-31 ENCOUNTER — Ambulatory Visit
Admission: RE | Admit: 2017-01-31 | Discharge: 2017-01-31 | Disposition: A | Discharge status: Home / Self Care | Payer: Medicare Other | Source: Ambulatory Visit | Attending: Internal Medicine | Admitting: Internal Medicine

## 2017-01-31 DIAGNOSIS — I714 Abdominal aortic aneurysm, without rupture, unspecified: Secondary | ICD-10-CM

## 2017-12-29 ENCOUNTER — Other Ambulatory Visit: Payer: Self-pay | Admitting: Internal Medicine

## 2017-12-29 DIAGNOSIS — I714 Abdominal aortic aneurysm, without rupture, unspecified: Secondary | ICD-10-CM

## 2018-01-08 ENCOUNTER — Ambulatory Visit
Admission: RE | Admit: 2018-01-08 | Discharge: 2018-01-08 | Disposition: A | Discharge status: Home / Self Care | Payer: Medicare Other | Source: Ambulatory Visit | Attending: Internal Medicine | Admitting: Internal Medicine

## 2018-01-08 DIAGNOSIS — I714 Abdominal aortic aneurysm, without rupture, unspecified: Secondary | ICD-10-CM

## 2018-04-09 ENCOUNTER — Other Ambulatory Visit: Payer: Self-pay | Admitting: Internal Medicine

## 2018-04-09 DIAGNOSIS — M5441 Lumbago with sciatica, right side: Secondary | ICD-10-CM

## 2018-04-21 ENCOUNTER — Ambulatory Visit
Admission: RE | Admit: 2018-04-21 | Discharge: 2018-04-21 | Disposition: A | Discharge status: Home / Self Care | Payer: Medicare Other | Source: Ambulatory Visit | Attending: Internal Medicine | Admitting: Internal Medicine

## 2018-04-21 DIAGNOSIS — M5136 Other intervertebral disc degeneration, lumbar region: Secondary | ICD-10-CM | POA: Diagnosis not present

## 2018-04-21 DIAGNOSIS — M5440 Lumbago with sciatica, unspecified side: Secondary | ICD-10-CM | POA: Insufficient documentation

## 2018-04-21 DIAGNOSIS — M5441 Lumbago with sciatica, right side: Secondary | ICD-10-CM

## 2018-05-24 DIAGNOSIS — I700 Atherosclerosis of aorta: Secondary | ICD-10-CM | POA: Insufficient documentation

## 2018-09-04 DIAGNOSIS — M519 Unspecified thoracic, thoracolumbar and lumbosacral intervertebral disc disorder: Secondary | ICD-10-CM | POA: Insufficient documentation

## 2019-05-16 DIAGNOSIS — Z87898 Personal history of other specified conditions: Secondary | ICD-10-CM | POA: Insufficient documentation

## 2019-07-18 ENCOUNTER — Other Ambulatory Visit: Payer: Self-pay

## 2019-07-18 ENCOUNTER — Other Ambulatory Visit
Admission: RE | Admit: 2019-07-18 | Discharge: 2019-07-18 | Disposition: A | Discharge status: Home / Self Care | Payer: Medicare Other | Source: Ambulatory Visit | Attending: Internal Medicine | Admitting: Internal Medicine

## 2019-07-18 DIAGNOSIS — Z20828 Contact with and (suspected) exposure to other viral communicable diseases: Secondary | ICD-10-CM | POA: Insufficient documentation

## 2019-07-18 DIAGNOSIS — Z01812 Encounter for preprocedural laboratory examination: Secondary | ICD-10-CM | POA: Insufficient documentation

## 2019-07-18 LAB — SARS CORONAVIRUS 2 (TAT 6-24 HRS): SARS Coronavirus 2: NEGATIVE

## 2019-07-19 ENCOUNTER — Encounter: Payer: Self-pay | Admitting: *Deleted

## 2019-07-22 ENCOUNTER — Ambulatory Visit: Payer: Medicare Other | Admitting: Certified Registered Nurse Anesthetist

## 2019-07-22 ENCOUNTER — Other Ambulatory Visit: Payer: Self-pay

## 2019-07-22 ENCOUNTER — Encounter
Admission: RE | Disposition: A | Discharge status: Home / Self Care | Payer: Self-pay | Source: Home / Self Care | Attending: Internal Medicine

## 2019-07-22 ENCOUNTER — Encounter: Payer: Self-pay | Admitting: *Deleted

## 2019-07-22 ENCOUNTER — Ambulatory Visit
Admission: RE | Admit: 2019-07-22 | Discharge: 2019-07-22 | Disposition: A | Discharge status: Home / Self Care | Payer: Medicare Other | Attending: Internal Medicine | Admitting: Internal Medicine

## 2019-07-22 DIAGNOSIS — Z8711 Personal history of peptic ulcer disease: Secondary | ICD-10-CM | POA: Insufficient documentation

## 2019-07-22 DIAGNOSIS — Z791 Long term (current) use of non-steroidal anti-inflammatories (NSAID): Secondary | ICD-10-CM | POA: Diagnosis not present

## 2019-07-22 DIAGNOSIS — Z86010 Personal history of colonic polyps: Secondary | ICD-10-CM | POA: Insufficient documentation

## 2019-07-22 DIAGNOSIS — Z87891 Personal history of nicotine dependence: Secondary | ICD-10-CM | POA: Insufficient documentation

## 2019-07-22 DIAGNOSIS — I252 Old myocardial infarction: Secondary | ICD-10-CM | POA: Diagnosis not present

## 2019-07-22 DIAGNOSIS — I110 Hypertensive heart disease with heart failure: Secondary | ICD-10-CM | POA: Insufficient documentation

## 2019-07-22 DIAGNOSIS — R1310 Dysphagia, unspecified: Secondary | ICD-10-CM | POA: Insufficient documentation

## 2019-07-22 DIAGNOSIS — K2100 Gastro-esophageal reflux disease with esophagitis, without bleeding: Secondary | ICD-10-CM | POA: Diagnosis not present

## 2019-07-22 DIAGNOSIS — I2510 Atherosclerotic heart disease of native coronary artery without angina pectoris: Secondary | ICD-10-CM | POA: Diagnosis not present

## 2019-07-22 DIAGNOSIS — Z1211 Encounter for screening for malignant neoplasm of colon: Secondary | ICD-10-CM | POA: Diagnosis present

## 2019-07-22 DIAGNOSIS — E785 Hyperlipidemia, unspecified: Secondary | ICD-10-CM | POA: Diagnosis not present

## 2019-07-22 DIAGNOSIS — K449 Diaphragmatic hernia without obstruction or gangrene: Secondary | ICD-10-CM | POA: Diagnosis not present

## 2019-07-22 DIAGNOSIS — Z79899 Other long term (current) drug therapy: Secondary | ICD-10-CM | POA: Diagnosis not present

## 2019-07-22 DIAGNOSIS — I509 Heart failure, unspecified: Secondary | ICD-10-CM | POA: Diagnosis not present

## 2019-07-22 DIAGNOSIS — K228 Other specified diseases of esophagus: Secondary | ICD-10-CM | POA: Diagnosis not present

## 2019-07-22 HISTORY — DX: Peptic ulcer, site unspecified, unspecified as acute or chronic, without hemorrhage or perforation: K27.9

## 2019-07-22 HISTORY — PX: ESOPHAGOGASTRODUODENOSCOPY (EGD) WITH PROPOFOL: SHX5813

## 2019-07-22 HISTORY — PX: COLONOSCOPY WITH PROPOFOL: SHX5780

## 2019-07-22 HISTORY — DX: Atherosclerotic heart disease of native coronary artery without angina pectoris: I25.10

## 2019-07-22 HISTORY — PX: BALLOON DILATION: SHX5330

## 2019-07-22 SURGERY — COLONOSCOPY WITH PROPOFOL
Anesthesia: General

## 2019-07-22 MED ORDER — PROPOFOL 10 MG/ML IV BOLUS
INTRAVENOUS | Status: DC | PRN
  Administered 2019-07-22: 40 mg via INTRAVENOUS

## 2019-07-22 MED ORDER — LIDOCAINE HCL (CARDIAC) PF 100 MG/5ML IV SOSY
PREFILLED_SYRINGE | INTRAVENOUS | Status: DC | PRN
  Administered 2019-07-22: 100 mg via INTRAVENOUS

## 2019-07-22 MED ORDER — PHENYLEPHRINE HCL (PRESSORS) 10 MG/ML IV SOLN
INTRAVENOUS | Status: DC | PRN
  Administered 2019-07-22: 150 ug via INTRAVENOUS
  Administered 2019-07-22 (×2): 100 ug via INTRAVENOUS

## 2019-07-22 MED ORDER — GLYCOPYRROLATE 0.2 MG/ML IJ SOLN
INTRAMUSCULAR | Status: DC | PRN
  Administered 2019-07-22: .15 mg via INTRAVENOUS

## 2019-07-22 MED ORDER — SODIUM CHLORIDE 0.9 % IV SOLN
INTRAVENOUS | Status: DC
  Administered 2019-07-22: 09:00:00 via INTRAVENOUS

## 2019-07-22 MED ORDER — PROPOFOL 500 MG/50ML IV EMUL
INTRAVENOUS | Status: DC | PRN
  Administered 2019-07-22: 150 ug/kg/min via INTRAVENOUS

## 2019-07-22 MED ORDER — GLYCOPYRROLATE 0.2 MG/ML IJ SOLN
INTRAMUSCULAR | Status: AC
  Filled 2019-07-22: qty 1

## 2019-07-22 MED ORDER — EPHEDRINE SULFATE 50 MG/ML IJ SOLN
INTRAMUSCULAR | Status: AC
  Filled 2019-07-22: qty 1

## 2019-07-22 MED ORDER — PROPOFOL 500 MG/50ML IV EMUL
INTRAVENOUS | Status: AC
  Filled 2019-07-22: qty 50

## 2019-07-22 NOTE — Anesthesia Procedure Notes (Signed)
Performed by: Ilhan Madan, CRNA Pre-anesthesia Checklist: Patient identified, Emergency Drugs available, Suction available, Patient being monitored and Timeout performed Patient Re-evaluated:Patient Re-evaluated prior to induction Oxygen Delivery Method: Nasal cannula Induction Type: IV induction       

## 2019-07-22 NOTE — Interval H&P Note (Signed)
History and Physical Interval Note:  07/22/2019 10:17 AM  Jon Mcgrath  has presented today for surgery, with the diagnosis of HX POLYPS DYSPHAGIA.  The various methods of treatment have been discussed with the patient and family. After consideration of risks, benefits and other options for treatment, the patient has consented to  Procedure(s): COLONOSCOPY WITH PROPOFOL (N/A) ESOPHAGOGASTRODUODENOSCOPY (EGD) WITH PROPOFOL (N/A) BALLOON DILATION (N/A) as a surgical intervention.  The patient's history has been reviewed, patient examined, no change in status, stable for surgery.  I have reviewed the patient's chart and labs.  Questions were answered to the patient's satisfaction.     [place], [place]

## 2019-07-22 NOTE — Anesthesia Post-op Follow-up Note (Signed)
Anesthesia QCDR form completed.        

## 2019-07-22 NOTE — H&P (Signed)
Outpatient short stay form Pre-procedure 07/22/2019 9:38 AM Curby Carswell K. Alice Reichert, M.D.  Primary Physician: Emily Filbert, MD  Reason for visit: Dysphagia, personal history of adenomatous colon polyps  History of present illness: 78 year old male with a personal history of peptic ulcer disease and GERD presents for symptoms of intermittent solid food dysphagia without odynophagia.  Patient has history of adenomatous colon polyps with last colonoscopy being performed on January 08, 2013.    Current Facility-Administered Medications:  .  0.9 %  sodium chloride infusion, , Intravenous, Continuous, [place], Benay Pike, MD, Last Rate: 20 mL/hr at 07/22/19 0910  Medications Prior to Admission  Medication Sig Dispense Refill Last Dose  . atorvastatin (LIPITOR) 10 MG tablet Take 10 mg by mouth daily.   07/21/2019 at 0800  . lisinopril-hydrochlorothiazide (ZESTORETIC) 20-12.5 MG tablet Take 1 tablet by mouth daily.   07/21/2019 at 0800  . meloxicam (MOBIC) 15 MG tablet Take 15 mg by mouth daily.   07/21/2019 at 0800  . methocarbamol (ROBAXIN) 500 MG tablet Take 500 mg by mouth 2 (two) times daily as needed.   07/21/2019 at 0800  . omeprazole (PRILOSEC) 20 MG capsule Take 20 mg by mouth daily.   07/21/2019 at 0800  . tiZANidine (ZANAFLEX) 4 MG tablet Take 4 mg by mouth 2 (two) times daily as needed (for pain).   07/21/2019 at 0800  . traMADol (ULTRAM) 50 MG tablet Take 50 mg by mouth every 6 (six) hours as needed.   07/21/2019 at 0800  . amiodarone (PACERONE) 400 MG tablet Take 1 tablet (400 mg total) by mouth 2 (two) times daily. For one week then take Amiodarone 400 mg po daily thereafter. 60 tablet 1   . atorvastatin (LIPITOR) 20 MG tablet Take 1 tablet (20 mg total) by mouth daily at 6 PM. 30 tablet 1   . metoprolol tartrate (LOPRESSOR) 25 MG tablet Take 0.5 tablets (12.5 mg total) by mouth 2 (two) times daily. 30 tablet 1      No Known Allergies   Past Medical History:  Diagnosis Date  . CHF  (congestive heart failure) ([place])   . Coronary artery disease   . GERD (gastroesophageal reflux disease)   . Hyperlipidemia   . Hypertension   . Myocardial infarction ([place])   . Peptic ulcer disease     Review of systems:  Otherwise negative.    Physical Exam  Gen: Alert, oriented. Appears stated age.  HEENT: Day/AT. PERRLA. Lungs: CTA, no wheezes. CV: RR nl S1, S2. Abd: soft, benign, no masses. BS+ Ext: No edema. Pulses 2+    Planned procedures: Proceed with EGD with possible biopsy/esophageal dilatation and colonoscopy. The patient understands the nature of the planned procedure, indications, risks, alternatives and potential complications including but not limited to bleeding, infection, perforation, damage to internal organs and possible oversedation/side effects from anesthesia. The patient agrees and gives consent to proceed.  Please refer to procedure notes for findings, recommendations and patient disposition/instructions.     Ayari Liwanag K. Alice Reichert, M.D. Gastroenterology 07/22/2019  9:38 AM

## 2019-07-22 NOTE — Op Note (Signed)
[facility] Gastroenterology Patient Name: Jon Mcgrath Procedure Date: 07/22/2019 10:14 AM MRN: [id number] Account #: [id number] Date of Birth: 1940-10-08 Admit Type: Outpatient Age: 78 Room: [facility] ENDO ROOM 1 Gender: Male Note Status: Finalized Procedure:            Upper GI endoscopy Indications:          Dysphagia, Gastro-esophageal reflux disease, Failure to                        respond to medical treatment Providers:            Benay Pike. [place] MD, MD Medicines:            Propofol per Anesthesia Complications:        No immediate complications. Procedure:            Pre-Anesthesia Assessment:                       - The risks and benefits of the procedure and the                        sedation options and risks were discussed with the                        patient. All questions were answered and informed                        consent was obtained.                       - Patient identification and proposed procedure were                        verified prior to the procedure by the nurse. The                        procedure was verified in the procedure room.                       - ASA Grade Assessment: III - A patient with severe                        systemic disease.                       - After reviewing the risks and benefits, the patient                        was deemed in satisfactory condition to undergo the                        procedure.                       After obtaining informed consent, the endoscope was                        passed under direct vision. Throughout the procedure,                        the patient's blood pressure, pulse, and oxygen  saturations were monitored continuously. The Endoscope                        was introduced through the mouth, and advanced to the                        third part of duodenum. The upper GI endoscopy was                        accomplished without  difficulty. The patient tolerated                        the procedure well. Findings:      Diffuse mild mucosal changes characterized by altered texture and white       specks were found in the entire esophagus. Biopsies were obtained from       the proximal and distal esophagus with cold forceps for histology of       suspected eosinophilic esophagitis. Estimated blood loss was minimal.       The scope was withdrawn. Dilation was performed with a Maloney dilator       with mild resistance at 47 Fr.      The Z-line was irregular and was found at the gastroesophageal junction.       Mucosa was biopsied with a cold forceps for histology. One specimen       bottle was sent to pathology.      A 1 cm hiatal hernia was present.      The examined duodenum was normal.      The exam was otherwise without abnormality. Impression:           - Texture changed, white specked mucosa in the                        esophagus. Biopsied. Dilated.                       - Z-line irregular, at the gastroesophageal junction.                        Biopsied.                       - 1 cm hiatal hernia.                       - Normal examined duodenum.                       - The examination was otherwise normal. Recommendation:       - Monitor results to esophageal dilation                       - Await pathology results from EGD, also performed                        today.                       - Proceed with colonoscopy Procedure Code(s):    --- Professional ---                       [phone], Esophagogastroduodenoscopy,  flexible, transoral;                        with biopsy, single or multiple                       43450, Dilation of esophagus, by unguided sound or                        bougie, single or multiple passes Diagnosis Code(s):    --- Professional ---                       K21.9, Gastro-esophageal reflux disease without                        esophagitis                       R13.10,  Dysphagia, unspecified                       K44.9, Diaphragmatic hernia without obstruction or                        gangrene                       K22.8, Other specified diseases of esophagus CPT copyright 2019 American Medical Association. All rights reserved. The codes documented in this report are preliminary and upon coder review may  be revised to meet current compliance requirements. Efrain Sella MD, MD 07/22/2019 10:40:51 AM This report has been signed electronically. Number of Addenda: 0 Note Initiated On: 07/22/2019 10:14 AM Estimated Blood Loss: Estimated blood loss was minimal.      [facility]

## 2019-07-22 NOTE — Anesthesia Preprocedure Evaluation (Signed)
Anesthesia Evaluation  Patient identified by MRN, date of birth, ID band Patient awake    Reviewed: Allergy & Precautions, H&P , NPO status , Patient's Chart, lab work & pertinent test results, reviewed documented beta blocker date and time   Airway Mallampati: II   Neck ROM: full    Dental  (+) Poor Dentition   Pulmonary neg pulmonary ROS, former smoker,    Pulmonary exam normal        Cardiovascular Exercise Tolerance: Poor hypertension, On Medications + CAD, + Past MI and +CHF  negative cardio ROS Normal cardiovascular exam Rhythm:regular Rate:Normal     Neuro/Psych negative neurological ROS  negative psych ROS   GI/Hepatic negative GI ROS, Neg liver ROS, PUD, GERD  Medicated,  Endo/Other  negative endocrine ROS  Renal/GU negative Renal ROS  negative genitourinary   Musculoskeletal   Abdominal   Peds  Hematology negative hematology ROS (+)   Anesthesia Other Findings Past Medical History: No date: CHF (congestive heart failure) (H[facility]) No date: Coronary artery disease No date: GERD (gastroesophageal reflux disease) No date: Hyperlipidemia No date: Hypertension No date: Myocardial infarction ([place]) No date: Peptic ulcer disease Past Surgical History: No date: CARDIAC CATHETERIZATION No date: COLONOSCOPY 03/26/2012: CORONARY ARTERY BYPASS GRAFT     Comment:  Procedure: CORONARY ARTERY BYPASS GRAFTING (CABG);                Surgeon: Grace Isaac, MD;  Location: [place];                Service: Open Heart Surgery;  Laterality: N/A;  coronary               artery bypass grafting times five using left internal               mammary artery and right saphenous leg vein 03/26/2012: ENDOVEIN HARVEST OF GREATER SAPHENOUS VEIN     Comment:  Procedure: ENDOVEIN HARVEST OF GREATER SAPHENOUS VEIN;                Surgeon: Grace Isaac, MD;  Location: [place];                Service: Open Heart Surgery;  Laterality:  N/A;                endoscopic harvesting of right saphenous leg vein No date: HERNIA REPAIR BMI    Body Mass Index: 35.00 kg/m     Reproductive/Obstetrics negative OB ROS                             Anesthesia Physical Anesthesia Plan  ASA: III  Anesthesia Plan: General   Post-op Pain Management:    Induction:   PONV Risk Score and Plan:   Airway Management Planned:   Additional Equipment:   Intra-op Plan:   Post-operative Plan:   Informed Consent: I have reviewed the patients History and Physical, chart, labs and discussed the procedure including the risks, benefits and alternatives for the proposed anesthesia with the patient or authorized representative who has indicated his/her understanding and acceptance.     Dental Advisory Given  Plan Discussed with: CRNA  Anesthesia Plan Comments:         Anesthesia Quick Evaluation

## 2019-07-22 NOTE — Op Note (Signed)
[facility] Gastroenterology Patient Name: Jon Mcgrath Procedure Date: 07/22/2019 10:14 AM MRN: [id number] Account #: [id number] Date of Birth: 1941-06-08 Admit Type: Outpatient Age: 78 Room: [facility] ENDO ROOM 1 Gender: Male Note Status: Finalized Procedure:            Colonoscopy Indications:          Surveillance: Personal history of adenomatous polyps on                        last colonoscopy > 5 years ago Providers:            Lorie Apley K. Inaaya Vellucci MD, MD Medicines:            Propofol per Anesthesia Complications:        No immediate complications. Procedure:            Pre-Anesthesia Assessment:                       - The risks and benefits of the procedure and the                        sedation options and risks were discussed with the                        patient. All questions were answered and informed                        consent was obtained.                       - Patient identification and proposed procedure were                        verified prior to the procedure by the nurse. The                        procedure was verified in the procedure room.                       - ASA Grade Assessment: III - A patient with severe                        systemic disease.                       - After reviewing the risks and benefits, the patient                        was deemed in satisfactory condition to undergo the                        procedure.                       After obtaining informed consent, the colonoscope was                        passed under direct vision. Throughout the procedure,                        the patient's blood pressure, pulse, and oxygen  saturations were monitored continuously. The                        Colonoscope was introduced through the anus and                        advanced to the the cecum, identified by appendiceal                        orifice and ileocecal valve. The colonoscopy  was                        performed without difficulty. The patient tolerated the                        procedure well. The quality of the bowel preparation                        was good. The ileocecal valve, appendiceal orifice, and                        rectum were photographed. Findings:      The perianal and digital rectal examinations were normal. Pertinent       negatives include normal sphincter tone and no palpable rectal lesions.      The entire examined colon appeared normal on direct and retroflexion       views. Impression:           - The entire examined colon is normal on direct and                        retroflexion views.                       - No specimens collected. Recommendation:       - Await pathology results from EGD, also performed                        today.                       - Monitor results to esophageal dilation                       - Patient has a contact number available for                        emergencies. The signs and symptoms of potential                        delayed complications were discussed with the patient.                        Return to normal activities tomorrow. Written discharge                        instructions were provided to the patient.                       - Resume previous diet.                       -  Continue present medications.                       - No repeat colonoscopy due to age and the absence of                        advanced adenomas.                       - Return to physician assistant in 6 weeks.                       - The findings and recommendations were discussed with                        the patient. Procedure Code(s):    --- Professional ---                       [id number], Colorectal cancer screening; colonoscopy on                        individual at high risk Diagnosis Code(s):    --- Professional ---                       Z86.010, Personal history of colonic polyps CPT copyright 2019  American Medical Association. All rights reserved. The codes documented in this report are preliminary and upon coder review may  be revised to meet current compliance requirements. Efrain Sella MD, MD 07/22/2019 11:03:57 AM This report has been signed electronically. Number of Addenda: 0 Note Initiated On: 07/22/2019 10:14 AM Scope Withdrawal Time: 0 hours 6 minutes 34 seconds  Total Procedure Duration: 0 hours 14 minutes 0 seconds  Estimated Blood Loss: Estimated blood loss: none.      [facility]

## 2019-07-22 NOTE — Transfer of Care (Signed)
Immediate Anesthesia Transfer of Care Note  Patient: Jon Mcgrath  Procedure(s) Performed: COLONOSCOPY WITH PROPOFOL (N/A ) ESOPHAGOGASTRODUODENOSCOPY (EGD) WITH PROPOFOL (N/A ) BALLOON DILATION (N/A )  Patient Location: PACU  Anesthesia Type:General  Level of Consciousness: drowsy  Airway & Oxygen Therapy: Patient Spontanous Breathing and Patient connected to nasal cannula oxygen  Post-op Assessment: Report given to RN and Post -op Vital signs reviewed and stable  Post vital signs: Reviewed and stable  Last Vitals:  Vitals Value Taken Time  BP 105/69 07/22/19 1106  Temp 36.3 C 07/22/19 1106  Pulse 80 07/22/19 1106  Resp 23 07/22/19 1106  SpO2 92 % 07/22/19 1106    Last Pain:  Vitals:   07/22/19 1106  TempSrc: Tympanic  PainSc: Asleep         Complications: No apparent anesthesia complications

## 2019-07-23 LAB — SURGICAL PATHOLOGY

## 2019-07-30 NOTE — Anesthesia Postprocedure Evaluation (Signed)
Anesthesia Post Note  Patient: Jon Mcgrath  Procedure(s) Performed: COLONOSCOPY WITH PROPOFOL (N/A ) ESOPHAGOGASTRODUODENOSCOPY (EGD) WITH PROPOFOL (N/A ) BALLOON DILATION (N/A )  Patient location during evaluation: PACU Anesthesia Type: General Level of consciousness: awake and alert Pain management: pain level controlled Vital Signs Assessment: post-procedure vital signs reviewed and stable Respiratory status: spontaneous breathing, nonlabored ventilation, respiratory function stable and patient connected to nasal cannula oxygen Cardiovascular status: blood pressure returned to baseline and stable Postop Assessment: no apparent nausea or vomiting Anesthetic complications: no     Last Vitals:  Vitals:   07/22/19 1126 07/22/19 1136  BP: 125/79 138/77  Pulse: 71 68  Resp: (!) 36 17  Temp:    SpO2: 99% 96%    Last Pain:  Vitals:   07/23/19 0728  TempSrc:   PainSc: 0-No pain                 Molli Barrows

## 2020-07-13 ENCOUNTER — Other Ambulatory Visit: Payer: Self-pay | Admitting: Sports Medicine

## 2020-07-13 DIAGNOSIS — M1711 Unilateral primary osteoarthritis, right knee: Secondary | ICD-10-CM

## 2020-07-13 DIAGNOSIS — G8929 Other chronic pain: Secondary | ICD-10-CM

## 2020-07-13 DIAGNOSIS — M25461 Effusion, right knee: Secondary | ICD-10-CM

## 2020-07-24 DIAGNOSIS — E1151 Type 2 diabetes mellitus with diabetic peripheral angiopathy without gangrene: Secondary | ICD-10-CM | POA: Insufficient documentation

## 2020-07-24 DIAGNOSIS — I25728 Atherosclerosis of autologous artery coronary artery bypass graft(s) with other forms of angina pectoris: Secondary | ICD-10-CM | POA: Insufficient documentation

## 2020-07-28 ENCOUNTER — Other Ambulatory Visit: Payer: Self-pay

## 2020-07-28 ENCOUNTER — Ambulatory Visit
Admission: RE | Admit: 2020-07-28 | Discharge: 2020-07-28 | Disposition: A | Discharge status: Home / Self Care | Payer: Medicare Other | Source: Ambulatory Visit | Attending: Sports Medicine | Admitting: Sports Medicine

## 2020-07-28 DIAGNOSIS — M1711 Unilateral primary osteoarthritis, right knee: Secondary | ICD-10-CM | POA: Diagnosis present

## 2020-07-28 DIAGNOSIS — M25561 Pain in right knee: Secondary | ICD-10-CM | POA: Insufficient documentation

## 2020-07-28 DIAGNOSIS — M25461 Effusion, right knee: Secondary | ICD-10-CM

## 2020-07-28 DIAGNOSIS — G8929 Other chronic pain: Secondary | ICD-10-CM | POA: Diagnosis present

## 2020-08-09 ENCOUNTER — Other Ambulatory Visit: Payer: Self-pay

## 2020-08-09 ENCOUNTER — Emergency Department
Admission: EM | Admit: 2020-08-09 | Discharge: 2020-08-09 | Disposition: A | Discharge status: Home / Self Care | Payer: Medicare Other | Attending: Emergency Medicine | Admitting: Emergency Medicine

## 2020-08-09 ENCOUNTER — Encounter: Payer: Self-pay | Admitting: Emergency Medicine

## 2020-08-09 DIAGNOSIS — Z7984 Long term (current) use of oral hypoglycemic drugs: Secondary | ICD-10-CM | POA: Diagnosis not present

## 2020-08-09 DIAGNOSIS — I25810 Atherosclerosis of coronary artery bypass graft(s) without angina pectoris: Secondary | ICD-10-CM | POA: Insufficient documentation

## 2020-08-09 DIAGNOSIS — I509 Heart failure, unspecified: Secondary | ICD-10-CM | POA: Diagnosis not present

## 2020-08-09 DIAGNOSIS — Z87891 Personal history of nicotine dependence: Secondary | ICD-10-CM | POA: Diagnosis not present

## 2020-08-09 DIAGNOSIS — Z79899 Other long term (current) drug therapy: Secondary | ICD-10-CM | POA: Insufficient documentation

## 2020-08-09 DIAGNOSIS — I952 Hypotension due to drugs: Secondary | ICD-10-CM | POA: Diagnosis not present

## 2020-08-09 DIAGNOSIS — R42 Dizziness and giddiness: Secondary | ICD-10-CM | POA: Diagnosis not present

## 2020-08-09 DIAGNOSIS — Z7982 Long term (current) use of aspirin: Secondary | ICD-10-CM | POA: Insufficient documentation

## 2020-08-09 DIAGNOSIS — I2510 Atherosclerotic heart disease of native coronary artery without angina pectoris: Secondary | ICD-10-CM | POA: Insufficient documentation

## 2020-08-09 DIAGNOSIS — I110 Hypertensive heart disease with heart failure: Secondary | ICD-10-CM | POA: Diagnosis not present

## 2020-08-09 DIAGNOSIS — R531 Weakness: Secondary | ICD-10-CM | POA: Diagnosis not present

## 2020-08-09 LAB — COMPREHENSIVE METABOLIC PANEL
ALT: 34 U/L (ref 0–44)
AST: 36 U/L (ref 15–41)
Albumin: 3.7 g/dL (ref 3.5–5.0)
Alkaline Phosphatase: 64 U/L (ref 38–126)
Anion gap: 14 (ref 5–15)
BUN: 29 mg/dL — ABNORMAL HIGH (ref 8–23)
CO2: 23 mmol/L (ref 22–32)
Calcium: 7.5 mg/dL — ABNORMAL LOW (ref 8.9–10.3)
Chloride: 102 mmol/L (ref 98–111)
Creatinine, Ser: 1.73 mg/dL — ABNORMAL HIGH (ref 0.61–1.24)
GFR, Estimated: 40 mL/min — ABNORMAL LOW (ref >60)
Glucose, Bld: 133 mg/dL — ABNORMAL HIGH (ref 70–99)
Potassium: 4 mmol/L (ref 3.5–5.1)
Sodium: 139 mmol/L (ref 135–145)
Total Bilirubin: 0.7 mg/dL (ref 0.3–1.2)
Total Protein: 7.2 g/dL (ref 6.5–8.1)

## 2020-08-09 LAB — CBC WITH DIFFERENTIAL/PLATELET
Abs Immature Granulocytes: 0.03 10*3/uL (ref 0.00–0.07)
Basophils Absolute: 0.1 10*3/uL (ref 0.0–0.1)
Basophils Relative: 1 %
Eosinophils Absolute: 0.9 10*3/uL — ABNORMAL HIGH (ref 0.0–0.5)
Eosinophils Relative: 12 %
HCT: 42.3 % (ref 39.0–52.0)
Hemoglobin: 13.5 g/dL (ref 13.0–17.0)
Immature Granulocytes: 0 %
Lymphocytes Relative: 25 %
Lymphs Abs: 1.9 10*3/uL (ref 0.7–4.0)
MCH: 28.6 pg (ref 26.0–34.0)
MCHC: 31.9 g/dL (ref 30.0–36.0)
MCV: 89.6 fL (ref 80.0–100.0)
Monocytes Absolute: 0.6 10*3/uL (ref 0.1–1.0)
Monocytes Relative: 7 %
Neutro Abs: 4.1 10*3/uL (ref 1.7–7.7)
Neutrophils Relative %: 55 %
Platelets: 241 10*3/uL (ref 150–400)
RBC: 4.72 MIL/uL (ref 4.22–5.81)
RDW: 13.2 % (ref 11.5–15.5)
WBC: 7.6 10*3/uL (ref 4.0–10.5)
nRBC: 0.3 % — ABNORMAL HIGH (ref 0.0–0.2)

## 2020-08-09 LAB — URINALYSIS, ROUTINE W REFLEX MICROSCOPIC
Bilirubin Urine: NEGATIVE
Glucose, UA: NEGATIVE mg/dL
Hgb urine dipstick: NEGATIVE
Ketones, ur: NEGATIVE mg/dL
Leukocytes,Ua: NEGATIVE
Nitrite: NEGATIVE
Protein, ur: NEGATIVE mg/dL
Specific Gravity, Urine: 1.012 (ref 1.005–1.030)
pH: 6 (ref 5.0–8.0)

## 2020-08-09 LAB — BRAIN NATRIURETIC PEPTIDE: B Natriuretic Peptide: 41.2 pg/mL (ref 0.0–100.0)

## 2020-08-09 LAB — CBG MONITORING, ED: Glucose-Capillary: 121 mg/dL — ABNORMAL HIGH (ref 70–99)

## 2020-08-09 LAB — TROPONIN I (HIGH SENSITIVITY)
Troponin I (High Sensitivity): 8 ng/L (ref <18)
Troponin I (High Sensitivity): 13 ng/L (ref <18)

## 2020-08-09 MED ORDER — SODIUM CHLORIDE 0.9 % IV BOLUS
1000.0000 mL | Freq: Once | INTRAVENOUS | Status: AC
  Administered 2020-08-09: 1000 mL via INTRAVENOUS

## 2020-08-09 MED ORDER — SODIUM CHLORIDE 0.9 % IV SOLN: INTRAVENOUS | Status: DC

## 2020-08-09 NOTE — ED Provider Notes (Addendum)
[facility] Emergency Department Provider Note   ____________________________________________   First MD Initiated Contact with Patient 08/09/20 1735     (approximate)  I have reviewed the triage vital signs and the nursing notes.   HISTORY  Chief Complaint Weakness    HPI Jon Mcgrath is a 79 y.o. male with a past medical history of CABG, hypertension, CHF, and GERD who presents for an episode of lightheadedness after getting up to go to the bathroom.  Patient states that this feeling of lightheadedness resolved spontaneously after sitting back down.  Patient is concerned that this may be medication related as he is taking double doses of his tramadol as well as started on new blood pressure medication within this last week.  Patient states that the symptoms are only present when he is getting up from a seated position or sitting up from laying down.         Past Medical History:  Diagnosis Date  . CHF (congestive heart failure) ([place])   . Coronary artery disease   . GERD (gastroesophageal reflux disease)   . Hyperlipidemia   . Hypertension   . Myocardial infarction ([place])   . Peptic ulcer disease     Patient Active Problem List   Diagnosis Date Noted  . 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 S/P angioplasty with stent 03/24/2012  . HTN (hypertension) 03/24/2012  . GERD (gastroesophageal reflux disease) 03/24/2012  . NSTEMI (non-ST elevated myocardial infarction) ([place]) 03/24/2012  . AAA (abdominal aortic aneurysm) ([place]) 03/24/2012    Past Surgical History:  Procedure Laterality Date  . BALLOON DILATION N/A 07/22/2019   Procedure: BALLOON DILATION;  Surgeon: Toledo, Benay Pike, MD;  Location: [facility] ENDOSCOPY;  Service: Gastroenterology;  Laterality: N/A;  . CARDIAC CATHETERIZATION    . COLONOSCOPY    . COLONOSCOPY WITH PROPOFOL N/A 07/22/2019   Procedure: COLONOSCOPY WITH PROPOFOL;  Surgeon: Toledo, Benay Pike, MD;  Location: [facility] ENDOSCOPY;   Service: Gastroenterology;  Laterality: N/A;  . CORONARY ARTERY BYPASS GRAFT  03/26/2012   Procedure: CORONARY ARTERY BYPASS GRAFTING (CABG);  Surgeon: Grace Isaac, MD;  Location: [place];  Service: Open Heart Surgery;  Laterality: N/A;  coronary artery bypass grafting times five using left internal mammary artery and right saphenous leg vein  . ENDOVEIN HARVEST OF GREATER SAPHENOUS VEIN  03/26/2012   Procedure: ENDOVEIN HARVEST OF GREATER SAPHENOUS VEIN;  Surgeon: Grace Isaac, MD;  Location: [place];  Service: Open Heart Surgery;  Laterality: N/A;  endoscopic harvesting of right saphenous leg vein  . ESOPHAGOGASTRODUODENOSCOPY (EGD) WITH PROPOFOL N/A 07/22/2019   Procedure: ESOPHAGOGASTRODUODENOSCOPY (EGD) WITH PROPOFOL;  Surgeon: Toledo, Benay Pike, MD;  Location: [facility] ENDOSCOPY;  Service: Gastroenterology;  Laterality: N/A;  . HERNIA REPAIR      Prior to Admission medications   Medication Sig Start Date End Date Taking? Authorizing Provider  alendronate (FOSAMAX) 70 MG tablet Take 70 mg by mouth once a week. 08/03/20  Yes [provider]  aspirin EC 81 MG tablet Take 81 mg by mouth daily. Swallow whole.   Yes [provider]  atorvastatin (LIPITOR) 10 MG tablet Take 10 mg by mouth daily.   Yes [provider]  glipiZIDE (GLUCOTROL) 5 MG tablet Take 0.5 mg by mouth daily. 07/24/20  Yes [provider]  lisinopril-hydrochlorothiazide (ZESTORETIC) 20-12.5 MG tablet Take 1 tablet by mouth daily.   Yes [provider]  meloxicam (MOBIC) 15 MG tablet Take 7.5 mg by mouth daily.  Yes [provider]  omeprazole (PRILOSEC) 20 MG capsule Take 20 mg by mouth daily.   Yes [provider]  temazepam (RESTORIL) 30 MG capsule Take 30 mg by mouth at bedtime. 08/07/20  Yes [provider]  traMADol (ULTRAM) 50 MG tablet Take 50 mg by mouth every 6 (six) hours as needed.   Yes [provider]  atorvastatin (LIPITOR) 20 MG  tablet Take 1 tablet (20 mg total) by mouth daily at 6 PM. 03/31/12 03/31/13  Nani Skillern, PA-C    Allergies Patient has no known allergies.  Family History  Problem Relation Age of Onset  . Heart disease Father     Social History Social History   Tobacco Use  . Smoking status: Former Smoker    Quit date: 10/03/2005    Years since quitting: 14.8  . Smokeless tobacco: Never Used  Vaping Use  . Vaping Use: Never used  Substance Use Topics  . Alcohol use: Not Currently  . Drug use: No    Review of Systems Constitutional: No fever/chills Eyes: No visual changes. ENT: No sore throat. Cardiovascular: Denies chest pain. Respiratory: Denies shortness of breath. Gastrointestinal: No abdominal pain.  No nausea, no vomiting.  No diarrhea. Genitourinary: Negative for dysuria. Musculoskeletal: Negative for acute arthralgias Skin: Negative for rash. Neurological: Negative for headaches, weakness/numbness/paresthesias in any extremity Psychiatric: Negative for suicidal ideation/homicidal ideation   ____________________________________________   PHYSICAL EXAM:  VITAL SIGNS: ED Triage Vitals [08/09/20 1732]  Enc Vitals Group     BP      Pulse      Resp      Temp      Temp src      SpO2 96 %     Weight      Height      Head Circumference      Peak Flow      Pain Score      Pain Loc      Pain Edu?      Excl. in [place]?    Constitutional: Alert and oriented. Well appearing and in no acute distress. Eyes: Conjunctivae are normal. PERRL. Head: Atraumatic. Nose: No congestion/rhinnorhea. Mouth/Throat: Mucous membranes are moist. Neck: No stridor Cardiovascular: Grossly normal heart sounds.  Good peripheral circulation. Respiratory: Normal respiratory effort.  No retractions. Gastrointestinal: Soft and nontender. No distention. Musculoskeletal: No obvious deformities Neurologic:  Normal speech and language. No gross focal neurologic deficits are appreciated. Skin:   Skin is warm and dry. No rash noted.  Well-healed midline thoracic scar Psychiatric: Mood and affect are normal. Speech and behavior are normal.  ____________________________________________   LABS (all labs ordered are listed, but only abnormal results are displayed)  Labs Reviewed  CBC WITH DIFFERENTIAL/PLATELET - Abnormal; Notable for the following components:      Result Value   nRBC 0.3 (*)    Eosinophils Absolute 0.9 (*)    All other components within normal limits  URINALYSIS, ROUTINE W REFLEX MICROSCOPIC - Abnormal; Notable for the following components:   Color, Urine YELLOW (*)    APPearance CLEAR (*)    All other components within normal limits  COMPREHENSIVE METABOLIC PANEL - Abnormal; Notable for the following components:   Glucose, Bld 133 (*)    BUN 29 (*)    Creatinine, Ser 1.73 (*)    Calcium 7.5 (*)    GFR, Estimated 40 (*)    All other components within normal limits  CBG MONITORING, ED - Abnormal; Notable for  the following components:   Glucose-Capillary 121 (*)    All other components within normal limits  BRAIN NATRIURETIC PEPTIDE  TROPONIN I (HIGH SENSITIVITY)  TROPONIN I (HIGH SENSITIVITY)   ____________________________________________  EKG  ED ECG REPORT I, Naaman Plummer, the attending physician, personally viewed and interpreted this ECG.  Date: 08/09/2020 EKG Time: 1749 Rate: 116 Rhythm: normal sinus rhythm QRS Axis: normal Intervals: normal ST/T Wave abnormalities: normal Narrative Interpretation: no evidence of acute ischemia  PROCEDURES  Procedure(s) performed (including Critical Care):  .1-3 Lead EKG Interpretation Performed by: Naaman Plummer, MD Authorized by: Naaman Plummer, MD     Interpretation: normal     ECG rate:  91   ECG rate assessment: normal     Rhythm: sinus rhythm     Ectopy: none     Conduction: normal       ____________________________________________   INITIAL IMPRESSION / ASSESSMENT AND PLAN / ED  COURSE  As part of my medical decision making, I reviewed the following data within the [place] notes reviewed and incorporated, Labs reviewed, EKG interpreted, Old chart reviewed, Radiograph reviewed and Notes from prior ED visits reviewed and incorporated        Presents to the emergency department complaining of low blood pressure. Patient is otherwise asymptomatic without confusion, chest pain, hematuria, or SOB. +Nonadherence to antihypertensive regimen DDx: CV, AMI, heart failure, renal infarction or failure or other end organ damage.  Disposition: Discussed with patient their low blood pressure and need for close outpatient management of their hypotension.      ____________________________________________   FINAL CLINICAL IMPRESSION(S) / ED DIAGNOSES  Final diagnoses:  Episodic lightheadedness  Hypotension due to drugs     ED Discharge Orders    None       Note:  This document was prepared using Dragon voice recognition software and may include unintentional dictation errors.   Naaman Plummer, MD 08/09/20 2113    Naaman Plummer, MD 08/09/20 2113

## 2020-08-09 NOTE — ED Triage Notes (Signed)
Patient comes in via EMS for weakness after after getting up to go to bathroom and having an episode of dizziness/lightheadness. Patient A&O x4.

## 2020-12-30 DIAGNOSIS — I272 Pulmonary hypertension, unspecified: Secondary | ICD-10-CM | POA: Insufficient documentation

## 2021-01-26 DIAGNOSIS — I482 Chronic atrial fibrillation, unspecified: Secondary | ICD-10-CM | POA: Insufficient documentation

## 2021-07-18 DIAGNOSIS — M1711 Unilateral primary osteoarthritis, right knee: Secondary | ICD-10-CM | POA: Insufficient documentation

## 2021-07-28 DIAGNOSIS — G4733 Obstructive sleep apnea (adult) (pediatric): Secondary | ICD-10-CM | POA: Insufficient documentation

## 2021-10-02 NOTE — Discharge Instructions (Signed)
Instructions after Total Knee Replacement   Telsa Dillavou P. Sumayah Bearse, Jr., M.D.     Dept. of Orthopaedics & Sports Medicine  [facility]  [address]  [place], [place]  [address]  Phone: [phone]   Fax: [phone]    DIET: Drink plenty of non-alcoholic fluids. Resume your normal diet. Include foods high in fiber.  ACTIVITY:  You may use crutches or a walker with weight-bearing as tolerated, unless instructed otherwise. You may be weaned off of the walker or crutches by your Physical Therapist.  Do NOT place pillows under the knee. Anything placed under the knee could limit your ability to straighten the knee.   Continue doing gentle exercises. Exercising will reduce the pain and swelling, increase motion, and prevent muscle weakness.   Please continue to use the TED compression stockings for 6 weeks. You may remove the stockings at night, but should reapply them in the morning. Do not drive or operate any equipment until instructed.  WOUND CARE:  Continue to use the PolarCare or ice packs periodically to reduce pain and swelling. You may bathe or shower after the staples are removed at the first office visit following surgery.  MEDICATIONS: You may resume your regular medications. Please take the pain medication as prescribed on the medication. Do not take pain medication on an empty stomach. You have been given a prescription for a blood thinner (Lovenox or Coumadin). Please take the medication as instructed. (NOTE: After completing a 2 week course of Lovenox, take one Enteric-coated aspirin once a day. This along with elevation will help reduce the possibility of phlebitis in your operated leg.) Do not drive or drink alcoholic beverages when taking pain medications.  CALL THE OFFICE FOR: Temperature above 101 degrees Excessive bleeding or drainage on the dressing. Excessive swelling, coldness, or paleness of the toes. Persistent nausea and vomiting.  FOLLOW-UP:  You  should have an appointment to return to the office in 10-14 days after surgery. Arrangements have been made for continuation of Physical Therapy (either home therapy or outpatient therapy).   [facility] Department Directory         [email or web address]       [email or web address]          Cardiology  Appointments: [place] - [phone] [place] - [phone]  Endocrinology  Appointments: [place] - [phone] [place] - [phone]  Gastroenterology  Appointments: [place] - [phone] [place] - [phone]        General Surgery   Appointments: [place] - [phone]  Internal Medicine/Family Medicine  Appointments: [place] - [phone] [place] - [phone] [place] - [phone]  Metabolic and Weigh Loss Surgery  Appointments: [place] - [phone]        Neurology  Appointments: [place] - [phone] [place] - [phone]  Neurosurgery  Appointments: [place] - [phone]  Obstetrics & Gynecology  Appointments: [place] - [phone] [place] - [phone]        Pediatrics  Appointments: [place] - [phone] [place] - [phone]  Physiatry  Appointments: [place] -[phone]  Physical Therapy  Appointments: [place] - [phone] [place] - [phone]        Podiatry  Appointments: [place] - [phone] [place] - [phone]  Pulmonology  Appointments: [place] - [phone]  Rheumatology  Appointments: [place] - [phone]         Location: [facility]  [address] , [place]  [address]  [place] Location: [facility] [address] [place], [place]  [address]  [place] Location: [facility] [address] [place], [place]  [address]    

## 2021-10-14 ENCOUNTER — Encounter
Admission: RE | Admit: 2021-10-14 | Discharge: 2021-10-14 | Disposition: A | Discharge status: Home / Self Care | Payer: Commercial Managed Care - HMO | Source: Ambulatory Visit | Attending: Orthopedic Surgery | Admitting: Orthopedic Surgery

## 2021-10-14 ENCOUNTER — Other Ambulatory Visit: Payer: Self-pay

## 2021-10-14 VITALS — BP 145/84 | HR 86 | Resp 18 | Ht 69.0 in | Wt 250.0 lb

## 2021-10-14 DIAGNOSIS — Z01812 Encounter for preprocedural laboratory examination: Secondary | ICD-10-CM | POA: Insufficient documentation

## 2021-10-14 DIAGNOSIS — M1711 Unilateral primary osteoarthritis, right knee: Secondary | ICD-10-CM | POA: Insufficient documentation

## 2021-10-14 DIAGNOSIS — Z01818 Encounter for other preprocedural examination: Secondary | ICD-10-CM

## 2021-10-14 DIAGNOSIS — E1122 Type 2 diabetes mellitus with diabetic chronic kidney disease: Secondary | ICD-10-CM | POA: Insufficient documentation

## 2021-10-14 DIAGNOSIS — Z7901 Long term (current) use of anticoagulants: Secondary | ICD-10-CM

## 2021-10-14 DIAGNOSIS — N1832 Chronic kidney disease, stage 3b: Secondary | ICD-10-CM | POA: Diagnosis not present

## 2021-10-14 DIAGNOSIS — Z01810 Encounter for preprocedural cardiovascular examination: Secondary | ICD-10-CM | POA: Diagnosis not present

## 2021-10-14 HISTORY — DX: Type 2 diabetes mellitus without complications: E11.9

## 2021-10-14 HISTORY — DX: Sleep apnea, unspecified: G47.30

## 2021-10-14 HISTORY — DX: Unspecified osteoarthritis, unspecified site: M19.90

## 2021-10-14 HISTORY — DX: Dyspnea, unspecified: R06.00

## 2021-10-14 HISTORY — DX: Unspecified atrial fibrillation: I48.91

## 2021-10-14 LAB — COMPREHENSIVE METABOLIC PANEL
ALT: 22 U/L (ref 0–44)
AST: 23 U/L (ref 15–41)
Albumin: 4.2 g/dL (ref 3.5–5.0)
Alkaline Phosphatase: 55 U/L (ref 38–126)
Anion gap: 9 (ref 5–15)
BUN: 25 mg/dL — ABNORMAL HIGH (ref 8–23)
CO2: 25 mmol/L (ref 22–32)
Calcium: 9.5 mg/dL (ref 8.9–10.3)
Chloride: 104 mmol/L (ref 98–111)
Creatinine, Ser: 1.4 mg/dL — ABNORMAL HIGH (ref 0.61–1.24)
GFR, Estimated: 51 mL/min — ABNORMAL LOW (ref >60)
Glucose, Bld: 135 mg/dL — ABNORMAL HIGH (ref 70–99)
Potassium: 4.6 mmol/L (ref 3.5–5.1)
Sodium: 138 mmol/L (ref 135–145)
Total Bilirubin: 0.6 mg/dL (ref 0.3–1.2)
Total Protein: 7.8 g/dL (ref 6.5–8.1)

## 2021-10-14 LAB — SEDIMENTATION RATE: Sed Rate: 11 mm/hr (ref 0–20)

## 2021-10-14 LAB — HEMOGLOBIN A1C
Hgb A1c MFr Bld: 7.3 % — ABNORMAL HIGH (ref 4.8–5.6)
Mean Plasma Glucose: 162.81 mg/dL

## 2021-10-14 LAB — URINALYSIS, ROUTINE W REFLEX MICROSCOPIC
Bilirubin Urine: NEGATIVE
Glucose, UA: NEGATIVE mg/dL
Ketones, ur: NEGATIVE mg/dL
Leukocytes,Ua: NEGATIVE
Nitrite: NEGATIVE
Protein, ur: NEGATIVE mg/dL
Specific Gravity, Urine: 1.016 (ref 1.005–1.030)
Squamous Epithelial / HPF: NONE SEEN (ref 0–5)
pH: 5 (ref 5.0–8.0)

## 2021-10-14 LAB — PROTIME-INR
INR: 1.2 (ref 0.8–1.2)
Prothrombin Time: 15.1 seconds (ref 11.4–15.2)

## 2021-10-14 LAB — CBC
HCT: 46.5 % (ref 39.0–52.0)
Hemoglobin: 15 g/dL (ref 13.0–17.0)
MCH: 29 pg (ref 26.0–34.0)
MCHC: 32.3 g/dL (ref 30.0–36.0)
MCV: 89.9 fL (ref 80.0–100.0)
Platelets: 200 10*3/uL (ref 150–400)
RBC: 5.17 MIL/uL (ref 4.22–5.81)
RDW: 13.7 % (ref 11.5–15.5)
WBC: 9.9 10*3/uL (ref 4.0–10.5)
nRBC: 0 % (ref 0.0–0.2)

## 2021-10-14 LAB — SURGICAL PCR SCREEN
MRSA, PCR: NEGATIVE
Staphylococcus aureus: NEGATIVE

## 2021-10-14 LAB — C-REACTIVE PROTEIN: CRP: 0.6 mg/dL (ref <1.0)

## 2021-10-14 LAB — APTT: aPTT: 31 seconds (ref 24–36)

## 2021-10-14 NOTE — Patient Instructions (Signed)
Your procedure is scheduled on: 10/25/21 Report to [place]. To find out your arrival time please call [phone] between 1PM - 3PM on 10/22/21.  Remember: Instructions that are not followed completely may result in serious medical risk, up to and including death, or upon the discretion of your surgeon and anesthesiologist your surgery may need to be rescheduled.     _X__ 1. Do not eat food after midnight the night before your procedure.                 No gum chewing or hard candies. You may drink clear liquids up to 2 hours                 before you are scheduled to arrive for your surgery- DO not drink clear                 liquids within 2 hours of the start of your surgery.                 Clear Liquids include:  Diabetics clear water only  __X__2.  On the morning of surgery brush your teeth with toothpaste and water, you                 may rinse your mouth with mouthwash if you wish.  Do not swallow any              toothpaste of mouthwash.     _X__ 3.  No Alcohol for 24 hours before or after surgery.   _X__ 4.  Do Not Smoke or use e-cigarettes For 24 Hours Prior to Your Surgery.                 Do not use any chewable tobacco products for at least 6 hours prior to                 surgery.  ____  5.  Bring all medications with you on the day of surgery if instructed.   __X__  6.  Notify your doctor if there is any change in your medical condition      (cold, fever, infections).     Do not wear jewelry, make-up, hairpins, clips or nail polish. Do not wear lotions, powders, or perfumes.  Do not shave body hair 48 hours prior to surgery. Men may shave face and neck. Do not bring valuables to the hospital.    [facility] is not responsible for any belongings or valuables.  Contacts, dentures/partials or body piercings may not be worn into surgery. Bring a case for your contacts, glasses or hearing aids, a denture cup will  be supplied. Leave your suitcase in the car. After surgery it may be brought to your room. For patients admitted to the hospital, discharge time is determined by your treatment team.   Patients discharged the day of surgery will not be allowed to drive home.   Please read over the following fact sheets that you were given:   MRSA Information, CHG soap, Incentive Spirometer  __X__ Take these medicines the morning of surgery with A SIP OF WATER:    1. amLODipine (NORVASC) 5 MG tablet  2. metoprolol succinate (TOPROL-XL) 50 MG 24 hr tablet  3. omeprazole (PRILOSEC) 40 MG capsule  4. colchicine 0.6 MG tablet if needed  5.  6.  ____ Fleet Enema (as directed)   __X__ Use CHG Soap/SAGE wipes as  directed  ____ Use inhalers on the day of surgery  ____ Stop metformin/Janumet/Farxiga 2 days prior to surgery    ____ Take 1/2 of usual insulin dose the night before surgery. No insulin the morning          of surgery.   __X__ LAST DOSE OF ELIQUIS WILL BE 10/21/21 (EVENING)  __X__ Stop Anti-inflammatories 7 days before surgery such as Advil, Ibuprofen, Motrin,  BC or Goodies Powder, Naprosyn, Naproxen, Aleve  MELOXICAM  __X__ Stop all herbals and supplements, fish oil or vitamins for 1 week until after surgery.    __X__ Bring C-Pap to the hospital.

## 2021-10-15 NOTE — Pre-Procedure Instructions (Incomplete)
Cardiac Clearance on chart from Dr Ubaldo Glassing-

## 2021-10-21 ENCOUNTER — Other Ambulatory Visit
Admission: RE | Admit: 2021-10-21 | Discharge: 2021-10-21 | Disposition: A | Discharge status: Home / Self Care | Payer: Medicare Other | Source: Ambulatory Visit | Attending: Orthopedic Surgery | Admitting: Orthopedic Surgery

## 2021-10-21 ENCOUNTER — Other Ambulatory Visit: Payer: Self-pay

## 2021-10-21 DIAGNOSIS — Z20822 Contact with and (suspected) exposure to covid-19: Secondary | ICD-10-CM

## 2021-10-21 DIAGNOSIS — Z01812 Encounter for preprocedural laboratory examination: Secondary | ICD-10-CM | POA: Diagnosis present

## 2021-10-21 LAB — SARS CORONAVIRUS 2 (TAT 6-24 HRS): SARS Coronavirus 2: NEGATIVE

## 2021-10-22 LAB — TYPE AND SCREEN
ABO/RH(D): A POS
Antibody Screen: NEGATIVE

## 2021-10-24 ENCOUNTER — Encounter: Payer: Self-pay | Admitting: Orthopedic Surgery

## 2021-10-24 NOTE — H&P (Signed)
ORTHOPAEDIC HISTORY & PHYSICAL Jon Mcgrath, [place] - 10/14/2021 1:45 PM EST Formatting of this note is different from the original. [name] Chief Complaint:   Chief Complaint  Patient presents with   Knee Pain  H & P RIGHT KNEE   History of Present Illness:   Jon Mcgrath is a 81 y.o. male that presents to clinic today for his preoperative history and evaluation. The patient is scheduled to undergo a right total knee arthroplasty on 10/25/21 by Dr. Marry Guan. His pain began several years ago. The pain is located primarily along the medial aspect of the knee. He describes his pain as worse with weightbearing. He reports associated swelling with some giving way of the knee. He denies associated numbness or tingling, denies locking.   The patient's symptoms have progressed to the point that they decrease his quality of life. The patient has previously undergone conservative treatment including NSAIDS and injections to the knee without adequate control of his symptoms.  Patient sees Dr Ubaldo Glassing for atrial fibrillation and is anti-coagulated with Eliquis. Denies history of blood clots, lumbar surgery.   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 (CMS-HCC) 07/24/2020  CABG 2013   Chronic atrial fibrillation (CMS-HCC) 01/26/2021  Eliquis   Chronic heartburn 05/10/2019   Coronary artery disease   Hx of adenomatous colonic polyps 05/10/2019   Hyperlipidemia   Hyperlipidemia, mixed 02/18/2016   Hypertension   Moderate to severe pulmonary hypertension (CMS-HCC) 12/30/2020   Other dysphagia 05/10/2019   PUD (peptic ulcer disease)   Stage 3b chronic kidney disease 03/23/2014   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 (CMS-HCC) 07/24/2020   Past Surgical History:   Past Surgical History:  Procedure Laterality Date   COLONOSCOPY 11/29/2003  Sessile Serrated Polyp   CORONARY ARTERY BYPASS GRAFT 03/2012  [place]   COLONOSCOPY 01/08/2013  Adenomatous Polyps: CBF 01/2016; Recall Ltr mailed 11/24/2015 (dw)   COLONOSCOPY 07/22/2019  Normal Colon/PHx CP/No Repeat/TKT   EGD 07/22/2019  GERD/No Repeat/TKT   CARDIAC CATHETERIZATION   HERNIA REPAIR Right   Current Medications:  Current Outpatient Medications  Medication Sig Dispense Refill   amLODIPine (NORVASC) 5 MG tablet Take 1 tablet (5 mg total) by mouth at bedtime 90 tablet 3   apixaban (ELIQUIS) 5 mg tablet Take 1 tablet (5 mg total) by mouth every 12 (twelve) hours 60 tablet 11   aspirin 81 MG EC tablet Take 81 mg by mouth once daily   atorvastatin (LIPITOR) 10 MG tablet Take 1 tablet (10 mg total) by mouth once daily 90 tablet 3   carboxymethylcellulose (REFRESH PLUS) 0.5 % ophthalmic solution 1 drop daily with breakfast.   colchicine (COLCRYS) 0.6 mg tablet Take 1 tablet (0.6 mg total) by mouth 2 (two) times daily as needed (gout) 60 tablet 2   methocarbamoL (ROBAXIN) 500 MG tablet Take 1 tablet (500 mg total) by mouth 2 (two) times daily as needed 60 tablet 5   metoprolol tartrate (LOPRESSOR) 25 MG tablet Take by mouth   omeprazole (PRILOSEC) 20 MG DR capsule Take 1 capsule (20 mg total) by mouth 2 (two) times daily before meals BID per patient 180 capsule 3   temazepam (RESTORIL) 30 mg capsule Take 1 capsule (30 mg total) by mouth nightly as needed for Sleep 30 capsule 5  traMADoL (ULTRAM) 50 mg tablet Take 1 tablet (50 mg total) by mouth 2 (two) times daily 60 tablet 5   No current facility-administered medications for this visit.   Allergies:  Allergies  Allergen Reactions   Ace Inhibitors Kidney Disorder   Arb-Angiotensin Receptor Antagonist Kidney Disorder   Social History:  Social History   Socioeconomic History   Marital status: Widowed   Number of children: 5    Years of education: 8  Occupational History   Occupation: Retired Engineer, mining  Tobacco Use   Smoking status: Former  Packs/day: 1.00  Years: 40.00  Pack years: 40.00  Types: Cigarettes  Quit date: 02/15/2007  Years since quitting: 14.6   Smokeless tobacco: Never  Vaping Use   Vaping Use: Never used  Substance and Sexual Activity   Alcohol use: Not Currently   Drug use: Defer   Sexual activity: Defer  Partners: Female   Family History:  Family History  Problem Relation Age of Onset   Heart failure Father   Aortic aneurysm Father   Diabetes Sister   Heart failure Sister   Stroke Mother   Coronary Artery Disease (Blocked arteries around heart) Sister   Coronary Artery Disease (Blocked arteries around heart) Sister   Review of Systems:   A 10+ ROS was performed, reviewed, and the pertinent orthopaedic findings are documented in the HPI.   Physical Examination:   BP (!) 140/90 (BP Location: Left upper arm, Patient Position: Sitting, BP Cuff Size: Large Adult)   Ht 175.3 cm (5\' 9" )   Wt (!) 113.3 kg (249 lb 12.8 oz)   BMI 36.89 kg/m   Patient is a well-developed, well-nourished male in no acute distress. Patient has normal mood and affect. Patient is alert and oriented to person, place, and time.   HEENT: Atraumatic, normocephalic. Pupils equal and reactive to light. Extraocular motion intact. Noninjected sclera.  Cardiovascular: Regular rate and rhythm, with no murmurs, rubs, or gallops. Distal pulses palpable.  Respiratory: Lungs clear to auscultation bilaterally.   Right Knee: Soft tissue swelling: mild Effusion: mild Erythema: none Crepitance: mild Tenderness: medial Alignment: relative varus Mediolateral laxity: medial pseudolaxity Posterior sag: negative Patellar tracking: Good tracking without evidence of subluxation or tilt Atrophy: No significant atrophy.  Quadriceps tone was fair to good. Range of motion: 0/2/120 degrees  Sensation intact over the  saphenous, lateral sural cutaneous, superficial fibular, and deep fibular nerve distributions.  Tests Performed/Reviewed:  X-rays  3 views of the right knee were obtained. Images reveal severe loss of medial compartment joint space with significant osteophyte formation and subchondral sclerosis. No fractures or dislocations. No osseous abnormality noted.  I personally ordered and interpreted today's x-rays.  Impression:   ICD-10-CM  1. Primary osteoarthritis of right knee M17.11  2. Severe obesity (BMI 35.0-39.9) with comorbidity (CMS-HCC) E66.01   Plan:   The patient has end-stage degenerative changes of the right knee. It was explained to the patient that the condition is progressive in nature. Having failed conservative treatment, the patient has elected to proceed with a total joint arthroplasty. The patient will undergo a total joint arthroplasty with Dr. Marry Guan. The risks of surgery, including blood clot and infection, were discussed with the patient. Measures to reduce these risks, including the use of anticoagulation, perioperative antibiotics, and early ambulation were discussed. The importance of postoperative physical therapy was discussed with the patient. The patient elects to proceed with surgery. The patient is instructed to stop all blood thinners prior to surgery. The patient  is instructed to call the hospital the day before surgery to learn of the proper arrival time.   Contact our office with any questions or concerns. Follow up as indicated, or sooner should any new problems arise, if conditions worsen, or if they are otherwise concerned.   Jon Mcgrath, [facility] [place], [place] [address] Phone: [phone]  This note was generated in part with voice recognition software and I apologize for any typographical errors that were not detected and corrected.  Electronically signed by Jon Fudge, PA at 10/18/2021 3:15 PM EST

## 2021-10-25 ENCOUNTER — Inpatient Hospital Stay: Payer: Medicare Other | Admitting: Anesthesiology

## 2021-10-25 ENCOUNTER — Inpatient Hospital Stay
Admission: RE | Admit: 2021-10-25 | Discharge: 2021-10-26 | DRG: 470 | Disposition: A | Discharge status: Home / Self Care | Payer: Medicare Other | Attending: Orthopedic Surgery | Admitting: Orthopedic Surgery

## 2021-10-25 ENCOUNTER — Observation Stay: Payer: Medicare Other

## 2021-10-25 ENCOUNTER — Other Ambulatory Visit: Payer: Self-pay

## 2021-10-25 ENCOUNTER — Encounter: Payer: Self-pay | Admitting: Orthopedic Surgery

## 2021-10-25 ENCOUNTER — Encounter
Admission: RE | Disposition: A | Discharge status: Home / Self Care | Payer: Self-pay | Source: Home / Self Care | Attending: Orthopedic Surgery

## 2021-10-25 DIAGNOSIS — N1832 Chronic kidney disease, stage 3b: Secondary | ICD-10-CM | POA: Diagnosis present

## 2021-10-25 DIAGNOSIS — Z6836 Body mass index (BMI) 36.0-36.9, adult: Secondary | ICD-10-CM | POA: Diagnosis not present

## 2021-10-25 DIAGNOSIS — Z888 Allergy status to other drugs, medicaments and biological substances status: Secondary | ICD-10-CM | POA: Diagnosis not present

## 2021-10-25 DIAGNOSIS — I252 Old myocardial infarction: Secondary | ICD-10-CM | POA: Diagnosis not present

## 2021-10-25 DIAGNOSIS — G4733 Obstructive sleep apnea (adult) (pediatric): Secondary | ICD-10-CM | POA: Diagnosis present

## 2021-10-25 DIAGNOSIS — Z79891 Long term (current) use of opiate analgesic: Secondary | ICD-10-CM | POA: Diagnosis not present

## 2021-10-25 DIAGNOSIS — Z8249 Family history of ischemic heart disease and other diseases of the circulatory system: Secondary | ICD-10-CM | POA: Diagnosis not present

## 2021-10-25 DIAGNOSIS — I129 Hypertensive chronic kidney disease with stage 1 through stage 4 chronic kidney disease, or unspecified chronic kidney disease: Secondary | ICD-10-CM | POA: Diagnosis present

## 2021-10-25 DIAGNOSIS — M1711 Unilateral primary osteoarthritis, right knee: Principal | ICD-10-CM | POA: Diagnosis present

## 2021-10-25 DIAGNOSIS — I700 Atherosclerosis of aorta: Secondary | ICD-10-CM | POA: Diagnosis present

## 2021-10-25 DIAGNOSIS — I25728 Atherosclerosis of autologous artery coronary artery bypass graft(s) with other forms of angina pectoris: Secondary | ICD-10-CM | POA: Diagnosis present

## 2021-10-25 DIAGNOSIS — Z79899 Other long term (current) drug therapy: Secondary | ICD-10-CM | POA: Diagnosis not present

## 2021-10-25 DIAGNOSIS — E1151 Type 2 diabetes mellitus with diabetic peripheral angiopathy without gangrene: Secondary | ICD-10-CM | POA: Diagnosis present

## 2021-10-25 DIAGNOSIS — R1319 Other dysphagia: Secondary | ICD-10-CM | POA: Diagnosis present

## 2021-10-25 DIAGNOSIS — K219 Gastro-esophageal reflux disease without esophagitis: Secondary | ICD-10-CM | POA: Diagnosis present

## 2021-10-25 DIAGNOSIS — Z7901 Long term (current) use of anticoagulants: Secondary | ICD-10-CM

## 2021-10-25 DIAGNOSIS — E1122 Type 2 diabetes mellitus with diabetic chronic kidney disease: Secondary | ICD-10-CM | POA: Diagnosis present

## 2021-10-25 DIAGNOSIS — Z87891 Personal history of nicotine dependence: Secondary | ICD-10-CM

## 2021-10-25 DIAGNOSIS — I272 Pulmonary hypertension, unspecified: Secondary | ICD-10-CM | POA: Diagnosis present

## 2021-10-25 DIAGNOSIS — Z7982 Long term (current) use of aspirin: Secondary | ICD-10-CM

## 2021-10-25 DIAGNOSIS — M25761 Osteophyte, right knee: Secondary | ICD-10-CM | POA: Diagnosis present

## 2021-10-25 DIAGNOSIS — I482 Chronic atrial fibrillation, unspecified: Secondary | ICD-10-CM | POA: Diagnosis present

## 2021-10-25 DIAGNOSIS — E782 Mixed hyperlipidemia: Secondary | ICD-10-CM | POA: Diagnosis present

## 2021-10-25 DIAGNOSIS — Z955 Presence of coronary angioplasty implant and graft: Secondary | ICD-10-CM

## 2021-10-25 DIAGNOSIS — Z96659 Presence of unspecified artificial knee joint: Secondary | ICD-10-CM

## 2021-10-25 HISTORY — PX: KNEE ARTHROPLASTY: SHX992

## 2021-10-25 LAB — GLUCOSE, CAPILLARY
Glucose-Capillary: 134 mg/dL — ABNORMAL HIGH (ref 70–99)
Glucose-Capillary: 158 mg/dL — ABNORMAL HIGH (ref 70–99)
Glucose-Capillary: 191 mg/dL — ABNORMAL HIGH (ref 70–99)
Glucose-Capillary: 229 mg/dL — ABNORMAL HIGH (ref 70–99)

## 2021-10-25 SURGERY — ARTHROPLASTY, KNEE, TOTAL, USING IMAGELESS COMPUTER-ASSISTED NAVIGATION
Anesthesia: Spinal | Site: Knee | Laterality: Right

## 2021-10-25 MED ORDER — CEFAZOLIN SODIUM-DEXTROSE 2-4 GM/100ML-% IV SOLN: 2.0000 g | Freq: Four times a day (QID) | INTRAVENOUS | Status: AC

## 2021-10-25 MED ORDER — PANTOPRAZOLE SODIUM 40 MG PO TBEC
40.0000 mg | DELAYED_RELEASE_TABLET | Freq: Two times a day (BID) | ORAL | Status: DC
  Administered 2021-10-25 – 2021-10-26 (×2): 40 mg via ORAL
  Filled 2021-10-25 (×3): qty 1

## 2021-10-25 MED ORDER — METHOCARBAMOL 500 MG PO TABS: 500.0000 mg | ORAL_TABLET | Freq: Two times a day (BID) | ORAL | Status: DC | PRN

## 2021-10-25 MED ORDER — ONDANSETRON HCL 4 MG PO TABS: 4.0000 mg | ORAL_TABLET | Freq: Four times a day (QID) | ORAL | Status: DC | PRN

## 2021-10-25 MED ORDER — ACETAMINOPHEN 10 MG/ML IV SOLN
INTRAVENOUS | Status: DC | PRN
  Administered 2021-10-25: 1000 mg via INTRAVENOUS

## 2021-10-25 MED ORDER — TRAMADOL HCL 50 MG PO TABS
ORAL_TABLET | ORAL | Status: AC
  Administered 2021-10-25: 100 mg via ORAL
  Filled 2021-10-25: qty 2

## 2021-10-25 MED ORDER — CELECOXIB 200 MG PO CAPS
200.0000 mg | ORAL_CAPSULE | Freq: Two times a day (BID) | ORAL | Status: DC
  Administered 2021-10-26: 10:00:00 200 mg via ORAL

## 2021-10-25 MED ORDER — BUPIVACAINE HCL (PF) 0.5 % IJ SOLN
INTRAMUSCULAR | Status: DC | PRN
  Administered 2021-10-25: 3 mL

## 2021-10-25 MED ORDER — MAGNESIUM HYDROXIDE 400 MG/5ML PO SUSP
30.0000 mL | Freq: Every day | ORAL | Status: DC
  Administered 2021-10-25 – 2021-10-26 (×2): 30 mL via ORAL

## 2021-10-25 MED ORDER — PRONTOSAN WOUND IRRIGATION OPTIME
TOPICAL | Status: DC | PRN
Start: 2021-10-25 — End: 2021-10-25
  Administered 2021-10-25: 1

## 2021-10-25 MED ORDER — METOCLOPRAMIDE HCL 10 MG PO TABS
10.0000 mg | ORAL_TABLET | Freq: Three times a day (TID) | ORAL | Status: DC
  Administered 2021-10-25 – 2021-10-26 (×3): 10 mg via ORAL

## 2021-10-25 MED ORDER — OXYCODONE HCL 5 MG PO TABS: 5.0000 mg | ORAL_TABLET | Freq: Once | ORAL | Status: DC | PRN

## 2021-10-25 MED ORDER — TRANEXAMIC ACID-NACL 1000-0.7 MG/100ML-% IV SOLN
INTRAVENOUS | Status: AC
  Administered 2021-10-25: 1000 mg via INTRAVENOUS
  Filled 2021-10-25: qty 100

## 2021-10-25 MED ORDER — OXYCODONE HCL 5 MG/5ML PO SOLN: 5.0000 mg | Freq: Once | ORAL | Status: DC | PRN

## 2021-10-25 MED ORDER — FENTANYL CITRATE (PF) 100 MCG/2ML IJ SOLN
INTRAMUSCULAR | Status: AC
  Filled 2021-10-25: qty 2

## 2021-10-25 MED ORDER — POLYVINYL ALCOHOL 1.4 % OP SOLN
1.0000 [drp] | Freq: Every day | OPHTHALMIC | Status: DC | PRN
  Filled 2021-10-25: qty 15

## 2021-10-25 MED ORDER — OXYCODONE HCL 5 MG PO TABS
ORAL_TABLET | ORAL | Status: AC
  Filled 2021-10-25: qty 2

## 2021-10-25 MED ORDER — TEMAZEPAM 30 MG PO CAPS
30.0000 mg | ORAL_CAPSULE | Freq: Every day | ORAL | Status: DC
  Administered 2021-10-25: 30 mg via ORAL
  Filled 2021-10-25 (×2): qty 1

## 2021-10-25 MED ORDER — CHLORHEXIDINE GLUCONATE 0.12 % MT SOLN: 15.0000 mL | Freq: Once | OROMUCOSAL | Status: AC

## 2021-10-25 MED ORDER — 0.9 % SODIUM CHLORIDE (POUR BTL) OPTIME
TOPICAL | Status: DC | PRN
  Administered 2021-10-25: 500 mL

## 2021-10-25 MED ORDER — FENTANYL CITRATE (PF) 100 MCG/2ML IJ SOLN: 25.0000 ug | INTRAMUSCULAR | Status: DC | PRN

## 2021-10-25 MED ORDER — OXYCODONE HCL 5 MG PO TABS
10.0000 mg | ORAL_TABLET | ORAL | Status: DC | PRN
  Administered 2021-10-25: 10 mg via ORAL

## 2021-10-25 MED ORDER — PHENYLEPHRINE HCL-NACL 20-0.9 MG/250ML-% IV SOLN
INTRAVENOUS | Status: DC | PRN
  Administered 2021-10-25: 45 ug/min via INTRAVENOUS

## 2021-10-25 MED ORDER — PROMETHAZINE HCL 25 MG/ML IJ SOLN: 6.2500 mg | INTRAMUSCULAR | Status: DC | PRN

## 2021-10-25 MED ORDER — CEFAZOLIN SODIUM-DEXTROSE 2-4 GM/100ML-% IV SOLN
INTRAVENOUS | Status: AC
  Administered 2021-10-25: 2 g via INTRAVENOUS
  Filled 2021-10-25: qty 100

## 2021-10-25 MED ORDER — OXYCODONE HCL 5 MG PO TABS: 5.0000 mg | ORAL_TABLET | ORAL | Status: DC | PRN

## 2021-10-25 MED ORDER — FLEET ENEMA 7-19 GM/118ML RE ENEM: 1.0000 | ENEMA | Freq: Once | RECTAL | Status: DC | PRN

## 2021-10-25 MED ORDER — METOPROLOL SUCCINATE ER 25 MG PO TB24
50.0000 mg | ORAL_TABLET | Freq: Every morning | ORAL | Status: DC
  Administered 2021-10-26: 10:00:00 50 mg via ORAL
  Filled 2021-10-25: qty 2

## 2021-10-25 MED ORDER — SODIUM CHLORIDE 0.9 % IV SOLN: INTRAVENOUS | Status: DC

## 2021-10-25 MED ORDER — ACETAMINOPHEN 10 MG/ML IV SOLN: 1000.0000 mg | Freq: Once | INTRAVENOUS | Status: DC | PRN

## 2021-10-25 MED ORDER — DEXAMETHASONE SODIUM PHOSPHATE 10 MG/ML IJ SOLN
INTRAMUSCULAR | Status: AC
  Administered 2021-10-25: 8 mg via INTRAVENOUS
  Filled 2021-10-25: qty 1

## 2021-10-25 MED ORDER — COLCHICINE 0.6 MG PO TABS
0.6000 mg | ORAL_TABLET | Freq: Two times a day (BID) | ORAL | Status: DC | PRN
  Filled 2021-10-25: qty 1

## 2021-10-25 MED ORDER — METOCLOPRAMIDE HCL 10 MG PO TABS
ORAL_TABLET | ORAL | Status: AC
  Filled 2021-10-25: qty 1

## 2021-10-25 MED ORDER — TRANEXAMIC ACID-NACL 1000-0.7 MG/100ML-% IV SOLN: 1000.0000 mg | Freq: Once | INTRAVENOUS | Status: AC

## 2021-10-25 MED ORDER — ACETAMINOPHEN 325 MG PO TABS: 325.0000 mg | ORAL_TABLET | Freq: Four times a day (QID) | ORAL | Status: DC | PRN

## 2021-10-25 MED ORDER — FENTANYL CITRATE (PF) 100 MCG/2ML IJ SOLN
INTRAMUSCULAR | Status: DC | PRN
  Administered 2021-10-25: 50 ug via INTRAVENOUS

## 2021-10-25 MED ORDER — TRANEXAMIC ACID-NACL 1000-0.7 MG/100ML-% IV SOLN
INTRAVENOUS | Status: AC
  Filled 2021-10-25: qty 100

## 2021-10-25 MED ORDER — PROPOFOL 10 MG/ML IV BOLUS
INTRAVENOUS | Status: AC
  Filled 2021-10-25: qty 20

## 2021-10-25 MED ORDER — NEOMYCIN-POLYMYXIN B GU 40-200000 IR SOLN
Status: DC | PRN
  Administered 2021-10-25: 14 mL

## 2021-10-25 MED ORDER — METHOCARBAMOL 500 MG PO TABS
ORAL_TABLET | ORAL | Status: AC
  Administered 2021-10-25: 500 mg via ORAL
  Filled 2021-10-25: qty 1

## 2021-10-25 MED ORDER — ORAL CARE MOUTH RINSE: 15.0000 mL | Freq: Once | OROMUCOSAL | Status: AC

## 2021-10-25 MED ORDER — PHENOL 1.4 % MT LIQD
1.0000 | OROMUCOSAL | Status: DC | PRN
  Filled 2021-10-25: qty 177

## 2021-10-25 MED ORDER — CELECOXIB 200 MG PO CAPS: 400.0000 mg | ORAL_CAPSULE | Freq: Once | ORAL | Status: AC

## 2021-10-25 MED ORDER — ASPIRIN 81 MG PO CHEW
81.0000 mg | CHEWABLE_TABLET | Freq: Every day | ORAL | Status: DC
  Administered 2021-10-26: 10:00:00 81 mg via ORAL

## 2021-10-25 MED ORDER — PROPOFOL 500 MG/50ML IV EMUL
INTRAVENOUS | Status: DC | PRN
  Administered 2021-10-25: 100 ug/kg/min via INTRAVENOUS

## 2021-10-25 MED ORDER — OXYCODONE HCL 5 MG PO TABS
ORAL_TABLET | ORAL | Status: AC
  Filled 2021-10-25: qty 1

## 2021-10-25 MED ORDER — SODIUM CHLORIDE 0.9 % IR SOLN
Status: DC | PRN
  Administered 2021-10-25: 3000 mL

## 2021-10-25 MED ORDER — CHLORHEXIDINE GLUCONATE 0.12 % MT SOLN
OROMUCOSAL | Status: AC
  Administered 2021-10-25: 15 mL via OROMUCOSAL
  Filled 2021-10-25: qty 15

## 2021-10-25 MED ORDER — DIPHENHYDRAMINE HCL 12.5 MG/5ML PO ELIX
12.5000 mg | ORAL_SOLUTION | ORAL | Status: DC | PRN
  Filled 2021-10-25: qty 10

## 2021-10-25 MED ORDER — GABAPENTIN 300 MG PO CAPS: 300.0000 mg | ORAL_CAPSULE | Freq: Once | ORAL | Status: AC

## 2021-10-25 MED ORDER — AMLODIPINE BESYLATE 5 MG PO TABS
5.0000 mg | ORAL_TABLET | Freq: Every day | ORAL | Status: DC
  Filled 2021-10-25: qty 1

## 2021-10-25 MED ORDER — CELECOXIB 200 MG PO CAPS
ORAL_CAPSULE | ORAL | Status: AC
  Administered 2021-10-25: 400 mg via ORAL
  Filled 2021-10-25: qty 2

## 2021-10-25 MED ORDER — MENTHOL 3 MG MT LOZG
1.0000 | LOZENGE | OROMUCOSAL | Status: DC | PRN
  Filled 2021-10-25: qty 9

## 2021-10-25 MED ORDER — TRANEXAMIC ACID-NACL 1000-0.7 MG/100ML-% IV SOLN
1000.0000 mg | INTRAVENOUS | Status: AC
  Administered 2021-10-25: 1000 mg via INTRAVENOUS

## 2021-10-25 MED ORDER — OXYCODONE HCL 5 MG PO TABS
ORAL_TABLET | ORAL | Status: AC
  Administered 2021-10-25: 10 mg via ORAL
  Filled 2021-10-25: qty 1

## 2021-10-25 MED ORDER — SENNOSIDES-DOCUSATE SODIUM 8.6-50 MG PO TABS
1.0000 | ORAL_TABLET | Freq: Two times a day (BID) | ORAL | Status: DC
  Administered 2021-10-25 – 2021-10-26 (×2): 1 via ORAL
  Filled 2021-10-25 (×3): qty 1

## 2021-10-25 MED ORDER — METOPROLOL TARTRATE 25 MG PO TABS
25.0000 mg | ORAL_TABLET | Freq: Every evening | ORAL | Status: DC
Start: 2021-10-25 — End: 2021-10-25

## 2021-10-25 MED ORDER — FERROUS SULFATE 325 (65 FE) MG PO TABS
325.0000 mg | ORAL_TABLET | Freq: Two times a day (BID) | ORAL | Status: DC
  Administered 2021-10-25 – 2021-10-26 (×2): 325 mg via ORAL
  Filled 2021-10-25 (×3): qty 1

## 2021-10-25 MED ORDER — PROPOFOL 10 MG/ML IV BOLUS
INTRAVENOUS | Status: DC | PRN
  Administered 2021-10-25: 30 mg via INTRAVENOUS
  Administered 2021-10-25: 20 mg via INTRAVENOUS

## 2021-10-25 MED ORDER — PHENYLEPHRINE 40 MCG/ML (10ML) SYRINGE FOR IV PUSH (FOR BLOOD PRESSURE SUPPORT)
PREFILLED_SYRINGE | INTRAVENOUS | Status: DC | PRN
  Administered 2021-10-25: 120 ug via INTRAVENOUS

## 2021-10-25 MED ORDER — SODIUM CHLORIDE (PF) 0.9 % IJ SOLN: INTRAMUSCULAR | Status: DC | PRN

## 2021-10-25 MED ORDER — TRAMADOL HCL 50 MG PO TABS: 50.0000 mg | ORAL_TABLET | ORAL | Status: DC | PRN

## 2021-10-25 MED ORDER — GABAPENTIN 300 MG PO CAPS
ORAL_CAPSULE | ORAL | Status: AC
  Administered 2021-10-25: 300 mg via ORAL
  Filled 2021-10-25: qty 1

## 2021-10-25 MED ORDER — DEXAMETHASONE SODIUM PHOSPHATE 10 MG/ML IJ SOLN: 8.0000 mg | Freq: Once | INTRAMUSCULAR | Status: AC

## 2021-10-25 MED ORDER — ALUM & MAG HYDROXIDE-SIMETH 200-200-20 MG/5ML PO SUSP: 30.0000 mL | ORAL | Status: DC | PRN

## 2021-10-25 MED ORDER — ACETAMINOPHEN 10 MG/ML IV SOLN
INTRAVENOUS | Status: AC
  Administered 2021-10-25: 1000 mg via INTRAVENOUS
  Filled 2021-10-25: qty 100

## 2021-10-25 MED ORDER — ACETAMINOPHEN 10 MG/ML IV SOLN
INTRAVENOUS | Status: AC
  Filled 2021-10-25: qty 100

## 2021-10-25 MED ORDER — HYDROMORPHONE HCL 1 MG/ML IJ SOLN: 0.5000 mg | INTRAMUSCULAR | Status: DC | PRN

## 2021-10-25 MED ORDER — ATORVASTATIN CALCIUM 10 MG PO TABS
10.0000 mg | ORAL_TABLET | Freq: Every day | ORAL | Status: DC
  Administered 2021-10-26: 10:00:00 10 mg via ORAL
  Filled 2021-10-25: qty 1

## 2021-10-25 MED ORDER — BISACODYL 10 MG RE SUPP
10.0000 mg | Freq: Every day | RECTAL | Status: DC | PRN
  Filled 2021-10-25: qty 1

## 2021-10-25 MED ORDER — MAGNESIUM HYDROXIDE 400 MG/5ML PO SUSP
ORAL | Status: AC
  Filled 2021-10-25: qty 30

## 2021-10-25 MED ORDER — CHLORHEXIDINE GLUCONATE 4 % EX LIQD: 60.0000 mL | Freq: Once | CUTANEOUS | Status: DC

## 2021-10-25 MED ORDER — CEFAZOLIN SODIUM-DEXTROSE 2-4 GM/100ML-% IV SOLN
2.0000 g | INTRAVENOUS | Status: AC
  Administered 2021-10-25: 2 g via INTRAVENOUS

## 2021-10-25 MED ORDER — CEFAZOLIN SODIUM-DEXTROSE 2-4 GM/100ML-% IV SOLN
INTRAVENOUS | Status: AC
  Filled 2021-10-25: qty 100

## 2021-10-25 MED ORDER — SODIUM CHLORIDE (PF) 0.9 % IJ SOLN
INTRAMUSCULAR | Status: DC | PRN
  Administered 2021-10-25: 120 mL

## 2021-10-25 MED ORDER — ACETAMINOPHEN 10 MG/ML IV SOLN
1000.0000 mg | Freq: Four times a day (QID) | INTRAVENOUS | Status: DC
  Administered 2021-10-26: 08:00:00 1000 mg via INTRAVENOUS

## 2021-10-25 MED ORDER — CELECOXIB 200 MG PO CAPS
ORAL_CAPSULE | ORAL | Status: AC
  Administered 2021-10-25: 200 mg via ORAL
  Filled 2021-10-25: qty 1

## 2021-10-25 MED ORDER — APIXABAN 5 MG PO TABS
5.0000 mg | ORAL_TABLET | Freq: Two times a day (BID) | ORAL | Status: DC
  Administered 2021-10-26: 10:00:00 5 mg via ORAL
  Filled 2021-10-25 (×2): qty 1

## 2021-10-25 MED ORDER — ONDANSETRON HCL 4 MG/2ML IJ SOLN: 4.0000 mg | Freq: Four times a day (QID) | INTRAMUSCULAR | Status: DC | PRN

## 2021-10-25 SURGICAL SUPPLY — 80 items
ATTUNE MED DOME PAT 38 KNEE (Knees) ×1 IMPLANT
ATTUNE MED DOME PAT 38MM KNEE (Knees) ×1 IMPLANT
ATTUNE PS FEM RT SZ 6 CEM KNEE (Femur) ×2 IMPLANT
ATTUNE PSRP INSR SZ6 5 KNEE (Insert) ×1 IMPLANT
ATTUNE PSRP INSR SZ6 5MM KNEE (Insert) ×1 IMPLANT
BASE TIBIAL ROT PLAT SZ 7 KNEE (Knees) IMPLANT
BATTERY INSTRU NAVIGATION (MISCELLANEOUS) ×12 IMPLANT
BLADE SAW 70X12.5 (BLADE) ×3 IMPLANT
BLADE SAW 90X13X1.19 OSCILLAT (BLADE) ×3 IMPLANT
BLADE SAW 90X25X1.19 OSCILLAT (BLADE) ×3 IMPLANT
BONE CEMENT GENTAMICIN (Cement) ×6 IMPLANT
BSPLAT TIB 7 CMNT ROT PLAT STR (Knees) ×1 IMPLANT
BTRY SRG DRVR LF (MISCELLANEOUS) ×4
CEMENT BONE GENTAMICIN 40 (Cement) IMPLANT
COOLER POLAR GLACIER W/PUMP (MISCELLANEOUS) ×3 IMPLANT
DRAPE 3/4 80X56 (DRAPES) ×3 IMPLANT
DRAPE INCISE IOBAN 66X45 STRL (DRAPES) ×3 IMPLANT
DRSG DERMACEA 8X12 NADH (GAUZE/BANDAGES/DRESSINGS) ×3 IMPLANT
DRSG MEPILEX SACRM 8.7X9.8 (GAUZE/BANDAGES/DRESSINGS) ×3 IMPLANT
DRSG OPSITE POSTOP 4X14 (GAUZE/BANDAGES/DRESSINGS) ×3 IMPLANT
DRSG TEGADERM 4X4.75 (GAUZE/BANDAGES/DRESSINGS) ×3 IMPLANT
DURAPREP 26ML APPLICATOR (WOUND CARE) ×6 IMPLANT
ELECT CAUTERY BLADE 6.4 (BLADE) ×3 IMPLANT
ELECT REM PT RETURN 9FT ADLT (ELECTROSURGICAL) ×3
ELECTRODE REM PT RTRN 9FT ADLT (ELECTROSURGICAL) ×1 IMPLANT
EX-PIN ORTHOLOCK NAV 4X150 (PIN) ×4 IMPLANT
GLOVE SRG 8 PF TXTR STRL LF DI (GLOVE) ×1 IMPLANT
GLOVE SURG ENC TEXT LTX SZ7.5 (GLOVE) ×8 IMPLANT
GLOVE SURG UNDER POLY LF SZ7.5 (GLOVE) ×3 IMPLANT
GLOVE SURG UNDER POLY LF SZ8 (GLOVE) ×3
GOWN STRL REUS W/ TWL LRG LVL3 (GOWN DISPOSABLE) ×2 IMPLANT
GOWN STRL REUS W/ TWL XL LVL3 (GOWN DISPOSABLE) ×1 IMPLANT
GOWN STRL REUS W/TWL LRG LVL3 (GOWN DISPOSABLE) ×6
GOWN STRL REUS W/TWL XL LVL3 (GOWN DISPOSABLE) ×3
HEMOVAC 400CC 10FR (MISCELLANEOUS) ×3 IMPLANT
HOLDER FOLEY CATH W/STRAP (MISCELLANEOUS) ×3 IMPLANT
HOLSTER ELECTROSUGICAL PENCIL (MISCELLANEOUS) ×3 IMPLANT
IV NS IRRIG 3000ML ARTHROMATIC (IV SOLUTION) ×3 IMPLANT
KIT TURNOVER KIT A (KITS) ×3 IMPLANT
KNIFE SCULPS 14X20 (INSTRUMENTS) ×3 IMPLANT
LABEL OR SOLS (LABEL) ×3 IMPLANT
MANIFOLD NEPTUNE II (INSTRUMENTS) ×6 IMPLANT
NDL SAFETY ECLIPSE 18X1.5 (NEEDLE) ×1 IMPLANT
NDL SPNL 20GX3.5 QUINCKE YW (NEEDLE) ×2 IMPLANT
NEEDLE HYPO 18GX1.5 SHARP (NEEDLE) ×3
NEEDLE SPNL 20GX3.5 QUINCKE YW (NEEDLE) ×6 IMPLANT
NS IRRIG 500ML POUR BTL (IV SOLUTION) ×3 IMPLANT
PACK TOTAL KNEE (MISCELLANEOUS) ×3 IMPLANT
PAD ABD DERMACEA PRESS 5X9 (GAUZE/BANDAGES/DRESSINGS) ×6 IMPLANT
PAD WRAPON POLOR MULTI XL (MISCELLANEOUS) IMPLANT
PENCIL SMOKE EVACUATOR COATED (MISCELLANEOUS) ×3 IMPLANT
PIN DRILL FIX HALF THREAD (BIT) ×6 IMPLANT
PIN DRILL QUICK PACK ×6 IMPLANT
PIN FIXATION 1/8DIA X 3INL (PIN) ×5 IMPLANT
PULSAVAC PLUS IRRIG FAN TIP (DISPOSABLE) ×3
SOL PREP POV-IOD 4OZ 10% (MISCELLANEOUS) ×2 IMPLANT
SOL PREP PVP 2OZ (MISCELLANEOUS) ×3
SOLUTION PREP PVP 2OZ (MISCELLANEOUS) ×1 IMPLANT
SOLUTION PRONTOSAN WOUND 350ML (IRRIGATION / IRRIGATOR) ×3 IMPLANT
SPONGE DRAIN TRACH 4X4 STRL 2S (GAUZE/BANDAGES/DRESSINGS) ×3 IMPLANT
SPONGE T-LAP 18X18 ~~LOC~~+RFID (SPONGE) ×2 IMPLANT
STAPLER SKIN PROX 35W (STAPLE) ×3 IMPLANT
STOCKINETTE IMPERV 14X48 (MISCELLANEOUS) ×2 IMPLANT
STRAP TIBIA SHORT (MISCELLANEOUS) ×3 IMPLANT
SUCTION FRAZIER HANDLE 10FR (MISCELLANEOUS) ×2
SUCTION TUBE FRAZIER 10FR DISP (MISCELLANEOUS) ×1 IMPLANT
SUT VIC AB 0 CT1 36 (SUTURE) ×6 IMPLANT
SUT VIC AB 1 CT1 36 (SUTURE) ×6 IMPLANT
SUT VIC AB 2-0 CT2 27 (SUTURE) ×3 IMPLANT
SYR 20ML LL LF (SYRINGE) ×3 IMPLANT
SYR 30ML LL (SYRINGE) ×6 IMPLANT
TIBIAL BASE ROT PLAT SZ 7 KNEE (Knees) ×3 IMPLANT
TIP FAN IRRIG PULSAVAC PLUS (DISPOSABLE) ×1 IMPLANT
TOWEL OR 17X26 4PK STRL BLUE (TOWEL DISPOSABLE) ×3 IMPLANT
TOWER CARTRIDGE SMART MIX (DISPOSABLE) ×3 IMPLANT
TRAY FOLEY MTR SLVR 16FR STAT (SET/KITS/TRAYS/PACK) ×3 IMPLANT
WATER STERILE IRR 1000ML POUR (IV SOLUTION) ×4 IMPLANT
WATER STERILE IRR 500ML POUR (IV SOLUTION) ×3 IMPLANT
WRAP-ON POLOR PAD MULTI XL (MISCELLANEOUS) ×1
WRAPON POLOR PAD MULTI XL (MISCELLANEOUS) ×3

## 2021-10-25 NOTE — Anesthesia Preprocedure Evaluation (Addendum)
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 on medications and Pt. on home beta blockers pulmonary hypertension(-) angina+ CAD, + Past MI, + Cardiac Stents, + CABG (x5 2013) and +CHF (EF 45%)  Normal cardiovascular exam+ dysrhythmias (chronic) Atrial Fibrillation   ECHO 3/22:  INTERPRETATION  MILD LV SYSTOLIC DYSFUNCTION (See above)  NORMAL RIGHT VENTRICULAR SYSTOLIC FUNCTION  SEVERE VALVULAR REGURGITATION (See above)  NO VALVULAR STENOSIS  Contraction: MILD GLOBAL DECREASE  Closest EF: 45% (Estimated)  Aortic: TRIVIAL AR  Mitral: MILD MR  Tricuspid: SEVERE TR     Neuro/Psych negative neurological ROS  negative psych ROS   GI/Hepatic Neg liver ROS, GERD  Controlled and Medicated,  Endo/Other  diabetes (with peripheral angiopathy. 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)     Musculoskeletal  (+) Arthritis ,   Abdominal (+) + obese,   Peds  (+) NICU stay Hematology negative hematology ROS (+)   Anesthesia Other Findings EKG 10/14/21: Afib  Past Medical History: No date: Arthritis No date: Atrial fibrillation (HCC) No date: CHF (congestive heart failure) (HCC) No date: Coronary artery disease No date: Diabetes mellitus without complication (HCC) No date: Dyspnea No date: GERD  (gastroesophageal reflux disease) No date: Hyperlipidemia No date: Hypertension No date: Myocardial infarction ([place]) No date: Peptic ulcer disease No date: Sleep apnea  Past Surgical History: 07/22/2019: BALLOON DILATION; N/A     Comment:  Procedure: BALLOON DILATION;  Surgeon: Toledo, Benay Pike, MD;  Location: [facility];  Service:               Gastroenterology;  Laterality: N/A; No date: CARDIAC CATHETERIZATION No date: COLONOSCOPY 07/22/2019: COLONOSCOPY WITH PROPOFOL; N/A     Comment:  Procedure: COLONOSCOPY WITH PROPOFOL;  Surgeon: Toledo,               Benay Pike, MD;  Location: [facility];  Service:               Gastroenterology;  Laterality: N/A; 03/26/2012: CORONARY ARTERY BYPASS GRAFT     Comment:  Procedure: CORONARY ARTERY BYPASS GRAFTING (CABG);                Surgeon: Grace Isaac, MD;  Location: [place];                Service: Open Heart Surgery;  Laterality: N/A;  coronary               artery bypass grafting times five using left internal               mammary artery and right saphenous leg vein 03/26/2012: ENDOVEIN HARVEST OF GREATER SAPHENOUS VEIN     Comment:  Procedure: ENDOVEIN HARVEST OF GREATER SAPHENOUS VEIN;  Surgeon: Grace Isaac, MD;  Location: [place];                Service: Open Heart Surgery;  Laterality: N/A;                endoscopic harvesting of right saphenous leg vein 07/22/2019: ESOPHAGOGASTRODUODENOSCOPY (EGD) WITH PROPOFOL; N/A     Comment:  Procedure: ESOPHAGOGASTRODUODENOSCOPY (EGD) WITH               PROPOFOL;  Surgeon: Toledo, Benay Pike, MD;  Location:               [facility];  Service: Gastroenterology;  Laterality:               N/A; No date: FRACTURE SURGERY; Right     Comment:  arm No date: HERNIA REPAIR     Reproductive/Obstetrics negative OB ROS                      Anesthesia Physical Anesthesia Plan  ASA: 3  Anesthesia Plan: General/Spinal   Post-op  Pain Management:    Induction: Intravenous  PONV Risk Score and Plan: Ondansetron and Dexamethasone  Airway Management Planned: Simple Face Mask and Natural Airway  Additional Equipment:   Intra-op Plan:   Post-operative Plan:   Informed Consent: I have reviewed the patients History and Physical, chart, labs and discussed the procedure including the risks, benefits and alternatives for the proposed anesthesia with the patient or authorized representative who has indicated his/her understanding and acceptance.     Dental advisory given  Plan Discussed with: Anesthesiologist and CRNA  Anesthesia Plan Comments:       Anesthesia Quick Evaluation

## 2021-10-25 NOTE — H&P (Signed)
The patient has been re-examined, and the chart reviewed, and there have been no interval changes to the documented history and physical.    The risks, benefits, and alternatives have been discussed at length. The patient expressed understanding of the risks benefits and agreed with plans for surgical intervention.  Marimar Suber P. Ellesse Antenucci, Jr. M.D.    

## 2021-10-25 NOTE — Anesthesia Procedure Notes (Signed)
Spinal  Patient location during procedure: OR Reason for block: surgical anesthesia Staffing Anesthesiologist: Iran Ouch, MD Resident/CRNA: Fredderick Phenix, CRNA Preanesthetic Checklist Completed: patient identified, IV checked, site marked, risks and benefits discussed, surgical consent, monitors and equipment checked, pre-op evaluation and timeout performed Spinal Block Patient position: sitting Prep: DuraPrep Patient monitoring: heart rate, cardiac monitor, continuous pulse ox and blood pressure Approach: midline Location: L3-4 Injection technique: single-shot Needle Needle type: Sprotte  Needle gauge: 24 G Needle length: 9 cm Assessment Sensory level: T4 Events: CSF return

## 2021-10-25 NOTE — Op Note (Signed)
OPERATIVE NOTE  DATE OF SURGERY:  10/25/2021  PATIENT NAME:  Loy Mccartt   DOB: 27-Feb-1941  MRN: [id number]  PRE-OPERATIVE DIAGNOSIS: Degenerative arthrosis of the right knee, primary  POST-OPERATIVE DIAGNOSIS:  Same  PROCEDURE:  Right total knee arthroplasty using computer-assisted navigation  SURGEON:  Marciano Sequin. M.D.  ASSISTANT: Cassell Smiles, PA-C (present and scrubbed throughout the case, critical for assistance with exposure, retraction, instrumentation, and closure)  ANESTHESIA: spinal  ESTIMATED BLOOD LOSS: 50 mL  FLUIDS REPLACED: 1200 mL of crystalloid  TOURNIQUET TIME: 93 minutes  DRAINS: 2 medium Hemovac drains  SOFT TISSUE RELEASES: Anterior cruciate ligament, posterior cruciate ligament, deep medial collateral ligament, patellofemoral ligament  IMPLANTS UTILIZED: DePuy Attune size 6 posterior stabilized femoral component (cemented), size 7 rotating platform tibial component (cemented), 38 mm medialized dome patella (cemented), and a 5 mm stabilized rotating platform polyethylene insert.  INDICATIONS FOR SURGERY: Jerame Hedding is a 81 y.o. year old male with a long history of progressive knee pain. X-rays demonstrated severe degenerative changes in tricompartmental fashion. The patient had not seen any significant improvement despite conservative nonsurgical intervention. After discussion of the risks and benefits of surgical intervention, the patient expressed understanding of the risks benefits and agree with plans for total knee arthroplasty.   The risks, benefits, and alternatives were discussed at length including but not limited to the risks of infection, bleeding, nerve injury, stiffness, blood clots, the need for revision surgery, cardiopulmonary complications, among others, and they were willing to proceed.  PROCEDURE IN DETAIL: The patient was brought into the operating room and, after adequate spinal anesthesia was achieved, a tourniquet was  placed on the patient's upper thigh. The patient's knee and leg were cleaned and prepped with alcohol and DuraPrep and draped in the usual sterile fashion. A "timeout" was performed as per usual protocol. The lower extremity was exsanguinated using an Esmarch, and the tourniquet was inflated to 300 mmHg. An anterior longitudinal incision was made followed by a standard mid vastus approach. The deep fibers of the medial collateral ligament were elevated in a subperiosteal fashion off of the medial flare of the tibia so as to maintain a continuous soft tissue sleeve. The patella was subluxed laterally and the patellofemoral ligament was incised. Inspection of the knee demonstrated severe degenerative changes with full-thickness loss of articular cartilage. Osteophytes were debrided using a rongeur. Anterior and posterior cruciate ligaments were excised. Two 4.0 mm Schanz pins were inserted in the femur and into the tibia for attachment of the array of trackers used for computer-assisted navigation. Hip center was identified using a circumduction technique. Distal landmarks were mapped using the computer. The distal femur and proximal tibia were mapped using the computer. The distal femoral cutting guide was positioned using computer-assisted navigation so as to achieve a 5 distal valgus cut. The femur was sized and it was felt that a size 6 femoral component was appropriate. A size 6 femoral cutting guide was positioned and the anterior cut was performed and verified using the computer. This was followed by completion of the posterior and chamfer cuts. Femoral cutting guide for the central box was then positioned in the center box cut was performed.  Attention was then directed to the proximal tibia. Medial and lateral menisci were excised. The extramedullary tibial cutting guide was positioned using computer-assisted navigation so as to achieve a 0 varus-valgus alignment and 3 posterior slope. The cut was  performed and verified using the computer. The proximal tibia was  sized and it was felt that a size 7 tibial tray was appropriate. Tibial and femoral trials were inserted followed by insertion of a 5 mm polyethylene insert. This allowed for excellent mediolateral soft tissue balancing both in flexion and in full extension. Finally, the patella was cut and prepared so as to accommodate a 38 mm medialized dome patella. A patella trial was placed and the knee was placed through a range of motion with excellent patellar tracking appreciated. The femoral trial was removed after debridement of posterior osteophytes. The central post-hole for the tibial component was reamed followed by insertion of a keel punch. Tibial trials were then removed. Cut surfaces of bone were irrigated with copious amounts of normal saline using pulsatile lavage and then suctioned dry. Polymethylmethacrylate cement with gentamicin was prepared in the usual fashion using a vacuum mixer. Cement was applied to the cut surface of the proximal tibia as well as along the undersurface of a size 7 rotating platform tibial component. Tibial component was positioned and impacted into place. Excess cement was removed using [employer] fast streamer. Cement was then applied to the cut surfaces of the femur as well as along the posterior flanges of the size 6 femoral component. The femoral component was positioned and impacted into place. Excess cement was removed using [employer] fast streamer. A 5 mm polyethylene trial was inserted and the knee was brought into full extension with steady axial compression applied. Finally, cement was applied to the backside of a 38 mm medialized dome patella and the patellar component was positioned and patellar clamp applied. Excess cement was removed using [employer] fast streamer. After adequate curing of the cement, the tourniquet was deflated after a total tourniquet time of 93 minutes. Hemostasis was achieved using electrocautery. The knee was  irrigated with copious amounts of normal saline using pulsatile lavage followed by 350 ml of Prontosan and then suctioned dry. 20 mL of 1.3% Exparel and 60 mL of 0.25% Marcaine in 40 mL of normal saline was injected along the posterior capsule, medial and lateral gutters, and along the arthrotomy site. A 5 mm stabilized rotating platform polyethylene insert was inserted and the knee was placed through a range of motion with excellent mediolateral soft tissue balancing appreciated and excellent patellar tracking noted. 2 medium drains were placed in the wound bed and brought out through separate stab incisions. The medial parapatellar portion of the incision was reapproximated using interrupted sutures of #1 Vicryl. Subcutaneous tissue was approximated in layers using first #0 Vicryl followed #2-0 Vicryl. The skin was approximated with skin staples. A sterile dressing was applied.  The patient tolerated the procedure well and was transported to the recovery room in stable condition.    Dalya Maselli P. Holley Bouche., M.D.

## 2021-10-25 NOTE — Transfer of Care (Signed)
Immediate Anesthesia Transfer of Care Note  Patient: Jon Mcgrath  Procedure(s) Performed: COMPUTER ASSISTED TOTAL KNEE ARTHROPLASTY (Right: Knee)  Patient Location: PACU  Anesthesia Type:General  Level of Consciousness: awake and alert   Airway & Oxygen Therapy: Patient Spontanous Breathing and Patient connected to nasal cannula oxygen  Post-op Assessment: Report given to RN and Post -op Vital signs reviewed and stable  Post vital signs: Reviewed and stable  Last Vitals:  Vitals Value Taken Time  BP 100/51 10/25/21 1539  Temp    Pulse 85 10/25/21 1541  Resp 14 10/25/21 1541  SpO2 96 % 10/25/21 1541  Vitals shown include unvalidated device data.  Last Pain:  Vitals:   10/25/21 1012  TempSrc: Oral  PainSc: 0-No pain         Complications: No notable events documented.

## 2021-10-25 NOTE — Progress Notes (Signed)
PT Cancellation Note  Patient Details Name: Jon Mcgrath MRN: [id number] DOB: 11-01-1940   Cancelled Treatment:    Reason Eval/Treat Not Completed: Other (comment).  PT consult received.  Chart reviewed.  Pt in PACU s/p surgery (s/p spinal anesthesia); pt does not have adequate strength or sensation in B LE's to participate in physical therapy at this time.  Will re-attempt PT evaluation tomorrow.  Leitha Bleak, PT 10/25/21, 5:05 PM

## 2021-10-26 ENCOUNTER — Encounter: Payer: Self-pay | Admitting: Orthopedic Surgery

## 2021-10-26 LAB — GLUCOSE, CAPILLARY
Glucose-Capillary: 158 mg/dL — ABNORMAL HIGH (ref 70–99)
Glucose-Capillary: 144 mg/dL — ABNORMAL HIGH (ref 70–99)

## 2021-10-26 MED ORDER — ACETAMINOPHEN 10 MG/ML IV SOLN
INTRAVENOUS | Status: AC
  Filled 2021-10-26: qty 100

## 2021-10-26 MED ORDER — OXYCODONE HCL 5 MG PO TABS: 5.0000 mg | ORAL_TABLET | ORAL | 0 refills | Status: AC | PRN

## 2021-10-26 MED ORDER — CELECOXIB 200 MG PO CAPS
ORAL_CAPSULE | ORAL | Status: AC
  Filled 2021-10-26: qty 1

## 2021-10-26 MED ORDER — CELECOXIB 200 MG PO CAPS
200.0000 mg | ORAL_CAPSULE | Freq: Two times a day (BID) | ORAL | 0 refills | Status: AC
Start: 2021-10-26 — End: ?

## 2021-10-26 MED ORDER — METOCLOPRAMIDE HCL 10 MG PO TABS
ORAL_TABLET | ORAL | Status: AC
  Filled 2021-10-26: qty 1

## 2021-10-26 MED ORDER — MAGNESIUM HYDROXIDE 400 MG/5ML PO SUSP
ORAL | Status: AC
  Filled 2021-10-26: qty 30

## 2021-10-26 MED ORDER — TRAMADOL HCL 50 MG PO TABS: 50.0000 mg | ORAL_TABLET | ORAL | 0 refills | Status: AC | PRN

## 2021-10-26 MED ORDER — ASPIRIN 81 MG PO CHEW
CHEWABLE_TABLET | ORAL | Status: AC
  Filled 2021-10-26: qty 1

## 2021-10-26 MED ORDER — OXYCODONE HCL 5 MG PO TABS
ORAL_TABLET | ORAL | Status: AC
  Administered 2021-10-26: 04:00:00 10 mg via ORAL
  Filled 2021-10-26: qty 2

## 2021-10-26 MED ORDER — ACETAMINOPHEN 10 MG/ML IV SOLN
INTRAVENOUS | Status: AC
  Administered 2021-10-26: 02:00:00 1000 mg via INTRAVENOUS
  Filled 2021-10-26: qty 100

## 2021-10-26 NOTE — Evaluation (Signed)
Physical Therapy Evaluation Patient Details Name: Jon Mcgrath MRN: [id number] DOB: 1941/09/30 Today's Date: 10/26/2021  History of Present Illness  Pt is a 81 y.o. 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  PMH includes SOB with exertion, sleep apnea with CPAP, htn, pulmonary htn, CAD, h/o MI, CABG x5 2013, CHF, a-fib, DM.  Clinical Impression  Prior to surgery, pt was modified independent ambulating with walker (RW in home and rollator in community); was using [facility] prior to that but switched to walker use d/t knee issues.  Lives alone in 1 level home with 3-4 STE with B railings; pt's daughter able to stop in to assist as needed.  Currently pt is SBA semi-supine to sitting edge of bed; CGA with transfers using RW; and CGA ambulating 80 feet with RW.  Pt initially mildly shaky walking but improved gait mechanics noted with increased distance ambulating.  Minimal R knee pain reported during sessions activities.  Able to perform R LE SLR independently (so no KI utilized).  R knee AROM 0-90 degrees.  Pt would benefit from skilled PT to address noted impairments and functional limitations (see below for any additional details).  Upon hospital discharge, pt would benefit from [place] and support from family.    Recommendations for follow up therapy are one component of a multi-disciplinary discharge planning process, led by the attending physician.  Recommendations may be updated based on patient status, additional functional criteria and insurance authorization.  Follow Up Recommendations Home health PT    Assistance Recommended at Discharge Intermittent Supervision/Assistance  Patient can return home with the following  Assistance with cooking/housework;Assist for transportation;A little help with bathing/dressing/bathroom;Help with stairs or ramp for entrance    Equipment Recommendations Rolling walker (2 wheels);BSC/3in1  Recommendations for Other Services        Functional Status Assessment Patient has had a recent decline in their functional status and demonstrates the ability to make significant improvements in function in a reasonable and predictable amount of time.     Precautions / Restrictions Precautions Precautions: Fall;Knee Precaution Booklet Issued: Yes (comment) Required Braces or Orthoses: Knee Immobilizer - Right Knee Immobilizer - Right: Discontinue once straight leg raise with < 10 degree lag Restrictions Weight Bearing Restrictions: Yes RLE Weight Bearing: Weight bearing as tolerated      Mobility  Bed Mobility Overal bed mobility: Needs Assistance Bed Mobility: Supine to Sit     Supine to sit: Supervision, HOB elevated     General bed mobility comments: mild increased effort to perform on own    Transfers Overall transfer level: Needs assistance Equipment used: Rolling walker (2 wheels) Transfers: Sit to/from Stand Sit to Stand: Min guard           General transfer comment: vc's for UE/LE placement for transfers; mild increased effort to stand from bed    Ambulation/Gait Ambulation/Gait assistance: Min guard Gait Distance (Feet): 80 Feet Assistive device: Rolling walker (2 wheels)   Gait velocity: decreased     General Gait Details: initially step to progressing to step through gait pattern; antalgic; decreased stance time R LE; initial vc's for LE sequencing and walker use  Stairs            Wheelchair Mobility    Modified Rankin (Stroke Patients Only)       Balance Overall balance assessment: Needs assistance Sitting-balance support: No upper extremity supported, Feet supported Sitting balance-Leahy Scale: Good Sitting balance - Comments: steady sitting reaching within BOS   Standing balance  support: Single extremity supported Standing balance-Leahy Scale: Fair Standing balance comment: steady standing with at least single UE support                              Pertinent Vitals/Pain Pain Assessment Pain Assessment: Faces Faces Pain Scale: Hurts a little bit Pain Location: R knee Pain Descriptors / Indicators: Sore Pain Intervention(s): Limited activity within patient's tolerance, Monitored during session, Premedicated before session, Repositioned, Other (comment) (polar care applied) Vitals (HR and O2 on room air) stable and WFL throughout treatment session.    Home Living Family/patient expects to be discharged to:: Private residence Living Arrangements: Alone Available Help at Discharge: Family Type of Home: Mobile home Home Access: Stairs to enter Entrance Stairs-Rails: Right;Left;Can reach both Entrance Stairs-Number of Steps: 3-4   Home Layout: One level Home Equipment: Conservation officer, nature (2 wheels);Rollator (4 wheels);Cane - single point      Prior Function Prior Level of Function : Independent/Modified Independent             Mobility Comments: Ambulatory with RW in home and 4ww in community; was using [facility] prior to that (until needed walker d/t knee issues).  Pt reports no recent falls.       Hand Dominance        Extremity/Trunk Assessment   Upper Extremity Assessment Upper Extremity Assessment: Overall WFL for tasks assessed    Lower Extremity Assessment Lower Extremity Assessment: RLE deficits/detail (L LE WFL) RLE Deficits / Details: able to perform R LE SLR independently; at least 3/5 AROM hip flexion and ankle DF/PF RLE: Unable to fully assess due to pain    Cervical / Trunk Assessment Cervical / Trunk Assessment: Normal  Communication   Communication: No difficulties  Cognition Arousal/Alertness: Awake/alert Behavior During Therapy: WFL for tasks assessed/performed Overall Cognitive Status: Within Functional Limits for tasks assessed                                          General Comments General comments (skin integrity, edema, etc.): mild drainage noted to R knee honeycomb  dressing.  Pt agreeable to PT session.  Pt's son present during session.    Exercises Total Joint Exercises Ankle Circles/Pumps: AROM, Strengthening, Both, 10 reps, Supine Quad Sets: AROM, Strengthening, Both, 10 reps, Supine Short Arc Quad: AROM, Strengthening, Right, 10 reps, Supine Heel Slides: AAROM, Strengthening, Right, 10 reps, Supine Hip ABduction/ADduction: AROM, Strengthening, Right, 10 reps, Supine Straight Leg Raises: AROM, Strengthening, Right, 10 reps, Supine Goniometric ROM: R knee AROM 0-90 degrees   Assessment/Plan    PT Assessment Patient needs continued PT services  PT Problem List Decreased strength;Decreased range of motion;Decreased activity tolerance;Decreased balance;Decreased mobility;Decreased knowledge of use of DME;Decreased knowledge of precautions;Pain;Decreased skin integrity       PT Treatment Interventions DME instruction;Gait training;Stair training;Functional mobility training;Therapeutic activities;Therapeutic exercise;Balance training;Patient/family education    PT Goals (Current goals can be found in the Care Plan section)  Acute Rehab PT Goals Patient Stated Goal: to go home today PT Goal Formulation: With patient/family Time For Goal Achievement: 11/09/21 Potential to Achieve Goals: Good    Frequency BID     Co-evaluation               AM-PAC PT "6 Clicks" Mobility  Outcome Measure Help needed turning from your back to your side while  in a flat bed without using bedrails?: None Help needed moving from lying on your back to sitting on the side of a flat bed without using bedrails?: A Little Help needed moving to and from a bed to a chair (including a wheelchair)?: A Little Help needed standing up from a chair using your arms (e.g., wheelchair or bedside chair)?: A Little Help needed to walk in hospital room?: A Little Help needed climbing 3-5 steps with a railing? : A Little 6 Click Score: 19    End of Session Equipment  Utilized During Treatment: Gait belt Activity Tolerance: Patient tolerated treatment well Patient left: in chair;with call bell/phone within reach;with family/visitor present;with SCD's reapplied;Other (comment) (B heels floating via towel rolls; polar care in place) Nurse Communication: Mobility status;Precautions PT Visit Diagnosis: Other abnormalities of gait and mobility (R26.89);Muscle weakness (generalized) (M62.81);Pain Pain - Right/Left: Right Pain - part of body: Knee    Time: 1610-9604 PT Time Calculation (min) (ACUTE ONLY): 39 min   Charges:   PT Evaluation $PT Eval Low Complexity: 1 Low PT Treatments $Gait Training: 8-22 mins $Therapeutic Exercise: 8-22 mins       Leitha Bleak, PT 10/26/21, 9:40 AM

## 2021-10-26 NOTE — Evaluation (Signed)
Occupational Therapy Evaluation Patient Details Name: Jon Mcgrath MRN: [id number] DOB: 1940/12/27 Today's Date: 10/26/2021   History of Present Illness Pt is a 81 y.o. 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  PMH includes SOB with exertion, sleep apnea with CPAP, htn, pulmonary htn, CAD, h/o MI, CABG x5 2013, CHF, a-fib, DM.   Clinical Impression   Upon entering the room, pt seated in recliner chair and agreeable to OT intervention. Daughter present in room. OT educated pt and family on polar care use and handout provided with them verbalizing understanding. Pt able to demonstrate donning/doffing for home. OT educated pt on LB self care tasks and pt demonstrates the ability to thread underwear onto B feet. Pt stands with supervision and at times has no UE support while pulling pants over B hips with overall supervision. Pt will have intermittent assist at discharge from daughter and feels confident about returning home. Handout provided with all information discussed/educated on this session. OT to SIGN OFF at this time with no further needs.      Recommendations for follow up therapy are one component of a multi-disciplinary discharge planning process, led by the attending physician.  Recommendations may be updated based on patient status, additional functional criteria and insurance authorization.   Follow Up Recommendations  No OT follow up    Assistance Recommended at Discharge Set up Supervision/Assistance        Equipment Recommendations  BSC/3in1       Precautions / Restrictions Precautions Precautions: Fall;Knee Precaution Booklet Issued: Yes (comment) Required Braces or Orthoses: Knee Immobilizer - Right Knee Immobilizer - Right: Discontinue once straight leg raise with < 10 degree lag Restrictions Weight Bearing Restrictions: Yes RLE Weight Bearing: Weight bearing as tolerated      Mobility Bed Mobility               General bed  mobility comments: seated in recliner chair at start and end of session.    Transfers Overall transfer level: Needs assistance Equipment used: Rolling walker (2 wheels) Transfers: Sit to/from Stand Sit to Stand: Supervision                  Balance Overall balance assessment: Needs assistance Sitting-balance support: No upper extremity supported, Feet supported Sitting balance-Leahy Scale: Good     Standing balance support: During functional activity, Single extremity supported Standing balance-Leahy Scale: Fair                             ADL either performed or assessed with clinical judgement   ADL Overall ADL's : Needs assistance/impaired                                     Functional mobility during ADLs: Supervision/safety;Rolling walker (2 wheels) General ADL Comments: suprevision to don/doff polar care system with cuing for technique. OT educated pt on LB dressing and pt demonstrated by being able to don underwear with supervision overall.     Vision Patient Visual Report: No change from baseline              Pertinent Vitals/Pain Pain Assessment Pain Assessment: 0-10 Pain Score: 4  Pain Location: R knee Pain Descriptors / Indicators: Sore Pain Intervention(s): Limited activity within patient's tolerance, Monitored during session, Premedicated before session, Repositioned, Ice applied     Hand Dominance Right  Extremity/Trunk Assessment Upper Extremity Assessment Upper Extremity Assessment: Overall WFL for tasks assessed   Lower Extremity Assessment Lower Extremity Assessment: Defer to PT evaluation RLE Deficits / Details: able to perform R LE SLR independently; at least 3/5 AROM hip flexion and ankle DF/PF RLE: Unable to fully assess due to pain   Cervical / Trunk Assessment Cervical / Trunk Assessment: Normal   Communication Communication Communication: No difficulties   Cognition Arousal/Alertness:  Awake/alert Behavior During Therapy: WFL for tasks assessed/performed Overall Cognitive Status: Within Functional Limits for tasks assessed                                       General Comments  mild drainage noted to R knee honeycomb dressing            Home Living Family/patient expects to be discharged to:: Private residence Living Arrangements: Alone Available Help at Discharge: Family;Available PRN/intermittently Type of Home: Mobile home Home Access: Stairs to enter Entrance Stairs-Number of Steps: 3-4 Entrance Stairs-Rails: Right;Left;Can reach both Home Layout: One level     Bathroom Shower/Tub: Teacher, early years/pre: Standard     Home Equipment: Conservation officer, nature (2 wheels);Rollator (4 wheels);Cane - single point          Prior Functioning/Environment Prior Level of Function : Independent/Modified Independent             Mobility Comments: Ambulatory with RW in home and 4ww in community; was using [facility] prior to that (until needed walker d/t knee issues).  Pt reports no recent falls. ADLs Comments: Pt reports being ind in all aspects of care, IADLs, and driving.                 OT Goals(Current goals can be found in the care plan section) Acute Rehab OT Goals Patient Stated Goal: to return home OT Goal Formulation: With patient/family Time For Goal Achievement: 11/08/21 Potential to Achieve Goals: Good  OT Frequency:      AM-PAC OT "6 Clicks" Daily Activity     Outcome Measure Help from another person eating meals?: None Help from another person taking care of personal grooming?: None Help from another person toileting, which includes using toliet, bedpan, or urinal?: None Help from another person bathing (including washing, rinsing, drying)?: None Help from another person to put on and taking off regular upper body clothing?: None Help from another person to put on and taking off regular lower body clothing?: None 6  Click Score: 24   End of Session Equipment Utilized During Treatment: Rolling walker (2 wheels);Other (comment) (polar care system) Nurse Communication: Mobility status  Activity Tolerance: Patient tolerated treatment well Patient left: in chair;with call bell/phone within reach;with family/visitor present                   Time: 1041-1105 OT Time Calculation (min): 24 min Charges:  OT General Charges $OT Visit: 1 Visit OT Evaluation $OT Eval Moderate Complexity: 1 Mod OT Treatments $Self Care/Home Management : 8-22 mins  Darleen Crocker, MS, OTR/L , CBIS ascom [phone]  10/26/21, 1:52 PM

## 2021-10-26 NOTE — Progress Notes (Signed)
Physical Therapy Treatment Patient Details Name: Jon Mcgrath MRN: [id number] DOB: Aug 15, 1941 Today's Date: 10/26/2021   History of Present Illness Pt is a 81 y.o. 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  PMH includes SOB with exertion, sleep apnea with CPAP, htn, pulmonary htn, CAD, h/o MI, CABG x5 2013, CHF, a-fib, DM.    PT Comments    Pt resting in recliner upon PT arrival; agreeable to PT session; pt's son and daughter present during session.  Modified independent with bed mobility; modified independent with transfers using RW; SBA ambulating 200 feet with RW; and CGA to SBA navigating 4 steps with B railings.  Educated pt on home safety (including wearing appropriate footwear; safe method for navigating thresholds with walker; and turning on lights at night) and safe car transfer technique: pt and pt's family verbalizing appropriate understanding.  Pt appears safe to discharge home when medically appropriate (PA notified).    Recommendations for follow up therapy are one component of a multi-disciplinary discharge planning process, led by the attending physician.  Recommendations may be updated based on patient status, additional functional criteria and insurance authorization.  Follow Up Recommendations  Home health PT     Assistance Recommended at Discharge Intermittent Supervision/Assistance  Patient can return home with the following Assistance with cooking/housework;Assist for transportation;A little help with bathing/dressing/bathroom;Help with stairs or ramp for entrance   Equipment Recommendations  Rolling walker (2 wheels);BSC/3in1    Recommendations for Other Services       Precautions / Restrictions Precautions Precautions: Fall;Knee Precaution Booklet Issued: Yes (comment) Required Braces or Orthoses: Knee Immobilizer - Right Knee Immobilizer - Right: Discontinue once straight leg raise with < 10 degree lag Restrictions Weight Bearing  Restrictions: Yes RLE Weight Bearing: Weight bearing as tolerated     Mobility  Bed Mobility Overal bed mobility: Modified Independent Bed Mobility: Supine to Sit, Sit to Supine     Supine to sit: Modified independent (Device/Increase time) Sit to supine: Modified independent (Device/Increase time)   General bed mobility comments: mild increased effort to perform on own    Transfers Overall transfer level: Modified independent Equipment used: Rolling walker (2 wheels) Transfers: Sit to/from Stand, Bed to chair/wheelchair/BSC Sit to Stand: Modified independent (Device/Increase time)   Step pivot transfers: Modified independent (Device/Increase time) (stand step turn recliner to/from bed with RW use)       General transfer comment: steady safe transfers noted with RW use    Ambulation/Gait Ambulation/Gait assistance: Supervision Gait Distance (Feet): 200 Feet Assistive device: Rolling walker (2 wheels)   Gait velocity: mildly decreased     General Gait Details: partial step through progressing to step through gait pattern; mildly antalgic; decreased stance time R LE; steady   Stairs Stairs: Yes Stairs assistance: Min guard, Supervision Stair Management: Two rails, Step to pattern, Forwards Number of Stairs: 4 General stair comments: initial vc's for technique/LE sequencing; steady   Wheelchair Mobility    Modified Rankin (Stroke Patients Only)       Balance Overall balance assessment: Needs assistance Sitting-balance support: No upper extremity supported, Feet supported Sitting balance-Leahy Scale: Normal Sitting balance - Comments: steady sitting reaching outside BOS   Standing balance support: No upper extremity supported Standing balance-Leahy Scale: Good Standing balance comment: steady standing reaching within BOS                            Cognition Arousal/Alertness: Awake/alert Behavior During Therapy: [facility] for  tasks  assessed/performed Overall Cognitive Status: Within Functional Limits for tasks assessed                                          Exercises Total Joint Exercises Long Arc Quad: AROM, Strengthening, Right, 10 reps, Seated Knee Flexion: AROM, Strengthening, Right, 10 reps, Seated Goniometric ROM: R knee AROM 0-90 degrees (taken in morning session)    General Comments General comments (skin integrity, edema, etc.): mild drainage noted to R knee honeycomb dressing      Pertinent Vitals/Pain Pain Assessment Pain Assessment: 0-10 Pain Score: 4  Faces Pain Scale: Hurts a little bit Pain Location: R knee Pain Descriptors / Indicators: Sore Pain Intervention(s): Limited activity within patient's tolerance, Monitored during session, Premedicated before session, Repositioned, Other (comment) (polar care applied) Vitals (HR and O2 on room air) stable and WFL throughout treatment session.    Home Living Family/patient expects to be discharged to:: Private residence Living Arrangements: Alone Available Help at Discharge: Family;Available PRN/intermittently Type of Home: Mobile home Home Access: Stairs to enter Entrance Stairs-Rails: Right;Left;Can reach both Entrance Stairs-Number of Steps: 3-4   Home Layout: One level Home Equipment: Conservation officer, nature (2 wheels);Rollator (4 wheels);Cane - single point      Prior Function            PT Goals (current goals can now be found in the care plan section) Acute Rehab PT Goals Patient Stated Goal: to go home today PT Goal Formulation: With patient/family Time For Goal Achievement: 11/09/21 Potential to Achieve Goals: Good Progress towards PT goals: Progressing toward goals    Frequency    BID      PT Plan Current plan remains appropriate    Co-evaluation              AM-PAC PT "6 Clicks" Mobility   Outcome Measure  Help needed turning from your back to your side while in a flat bed without using  bedrails?: None Help needed moving from lying on your back to sitting on the side of a flat bed without using bedrails?: None Help needed moving to and from a bed to a chair (including a wheelchair)?: None Help needed standing up from a chair using your arms (e.g., wheelchair or bedside chair)?: None Help needed to walk in hospital room?: A Little Help needed climbing 3-5 steps with a railing? : A Little 6 Click Score: 22    End of Session Equipment Utilized During Treatment: Gait belt Activity Tolerance: Patient tolerated treatment well Patient left: in chair;with call bell/phone within reach;with family/visitor present;with SCD's reapplied;Other (comment) (B heels floating via towel rolls; polar care in place) Nurse Communication: Mobility status;Precautions;Other (comment) (unable to find pt's nurse but NT looking for nurse to let know pt good to go from PT standpoint) PT Visit Diagnosis: Other abnormalities of gait and mobility (R26.89);Muscle weakness (generalized) (M62.81);Pain Pain - Right/Left: Right Pain - part of body: Knee     Time: 7588-3254 PT Time Calculation (min) (ACUTE ONLY): 29 min  Charges:  $Gait Training: 8-22 mins $Therapeutic Activity: 8-22 mins                    Leitha Bleak, PT 10/26/21, 2:37 PM

## 2021-10-26 NOTE — Anesthesia Postprocedure Evaluation (Signed)
Anesthesia Post Note  Patient: Jon Mcgrath  Procedure(s) Performed: COMPUTER ASSISTED TOTAL KNEE ARTHROPLASTY (Right: Knee)  Patient location during evaluation: Nursing Unit Anesthesia Type: Spinal Level of consciousness: oriented and awake and alert Pain management: pain level controlled Vital Signs Assessment: post-procedure vital signs reviewed and stable Respiratory status: spontaneous breathing and respiratory function stable Cardiovascular status: blood pressure returned to baseline and stable Postop Assessment: no headache, no backache, no apparent nausea or vomiting and patient able to bend at knees Anesthetic complications: no   No notable events documented.   Last Vitals:  Vitals:   10/26/21 0000 10/26/21 0400  BP: 120/70 (!) 103/58  Pulse: 70 64  Resp: 16 16  Temp: (!) 36 C (!) 36 C  SpO2: 94% 95%    Last Pain:  Vitals:   10/26/21 0411  TempSrc:   PainSc: Asleep                 Hedda Slade

## 2021-10-26 NOTE — Progress Notes (Signed)
°  Subjective: 1 Day Post-Op Procedure(s) (LRB): COMPUTER ASSISTED TOTAL KNEE ARTHROPLASTY (Right) Patient reports pain as well-controlled.   Patient is well, and has had no acute complaints or problems Plan is to go Home after hospital stay. Negative for chest pain and shortness of breath Fever: no Gastrointestinal: negative for nausea and vomiting.   Patient has not had a bowel movement.  Objective: Vital signs in last 24 hours: Temp:  [96.8 F (36 C)-97.8 F (36.6 C)] 96.8 F (36 C) (01/24 0400) Pulse Rate:  [62-96] 64 (01/24 0400) Resp:  [12-21] 16 (01/24 0400) BP: (100-154)/(51-80) 103/58 (01/24 0400) SpO2:  [94 %-100 %] 95 % (01/24 0400) Weight:  [113.4 kg] 113.4 kg (01/23 1012)  Intake/Output from previous day:  Intake/Output Summary (Last 24 hours) at 10/26/2021 0817 Last data filed at 10/26/2021 0400 Gross per 24 hour  Intake 2845 ml  Output 735 ml  Net 2110 ml    Intake/Output this shift: No intake/output data recorded.  Labs: No results for input(s): HGB in the last 72 hours. No results for input(s): WBC, RBC, HCT, PLT in the last 72 hours. No results for input(s): NA, K, CL, CO2, BUN, CREATININE, GLUCOSE, CALCIUM in the last 72 hours. No results for input(s): LABPT, INR in the last 72 hours.   EXAM General - Patient is Alert, Appropriate, and Oriented Extremity - Neurovascular intact Dorsiflexion/Plantar flexion intact Compartment soft Dressing/Incision -Postoperative dressing remains in place., Polar Care in place and working. , Hemovac in place. , Following removal of post-op dressing, minimal drainage noted. Motor Function - intact, moving foot and toes well on exam. Able to perform independent SLR.  Cardiovascular- Regular rate and rhythm, no murmurs/rubs/gallops Respiratory- Lungs clear to auscultation bilaterally Gastrointestinal- soft, nontender, and active bowel sounds   Assessment/Plan: 1 Day Post-Op Procedure(s) (LRB): COMPUTER ASSISTED TOTAL  KNEE ARTHROPLASTY (Right) Principal Problem:   Total knee replacement status  Estimated body mass index is 36.92 kg/m as calculated from the following:   Height as of this encounter: 5\' 9"  (1.753 m).   Weight as of this encounter: 113.4 kg. Advance diet Up with therapy  Spoke with RN about applying TED hose to operative leg.   Post-op dressing removed. , Hemovac removed., and Mini compression dressing applied.   DVT Prophylaxis - Eliquis, Ted hose, and SCDs Weight-Bearing as tolerated to right leg  Cassell Smiles, PA-C [facility] Orthopaedic Surgery 10/26/2021, 8:17 AM

## 2021-10-26 NOTE — Discharge Summary (Signed)
Physician Discharge Summary  Patient ID: Jon Mcgrath MRN: [id number] DOB/AGE: 1940-12-03 81 y.o.  Admit date: 10/25/2021 Discharge date: 10/26/2021  Admission Diagnoses:  Total knee replacement status [Z96.659]  Surgeries:Procedure(s):  Right total knee arthroplasty using computer-assisted navigation   SURGEON:  Marciano Sequin. M.D.   ASSISTANT: Cassell Smiles, PA-C (present and scrubbed throughout the case, critical for assistance with exposure, retraction, instrumentation, and closure)   ANESTHESIA: spinal   ESTIMATED BLOOD LOSS: 50 mL   FLUIDS REPLACED: 1200 mL of crystalloid   TOURNIQUET TIME: 93 minutes   DRAINS: 2 medium Hemovac drains   SOFT TISSUE RELEASES: Anterior cruciate ligament, posterior cruciate ligament, deep medial collateral ligament, patellofemoral ligament   IMPLANTS UTILIZED: DePuy Attune size 6 posterior stabilized femoral component (cemented), size 7 rotating platform tibial component (cemented), 38 mm medialized dome patella (cemented), and a 5 mm stabilized rotating platform polyethylene insert.  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 ([place]) 07/24/2020   History of bradycardia 05/16/2019   Lumbar disc disease 09/04/2018   Aortic atherosclerosis ([place]) 05/24/2018   Tubular adenoma 01/11/2017   Benign essential hypertension 03/31/2016   Hyperlipidemia, mixed 02/18/2016   Stage 3b chronic kidney disease ([place]) 03/23/2014   CAD (coronary artery disease) of bypass graft 12/23/2013   S/P CABG x 5 03/30/2012   S/P angioplasty with  stent 03/24/2012   HTN (hypertension) 03/24/2012   GERD (gastroesophageal reflux disease) 03/24/2012   NSTEMI (non-ST elevated myocardial infarction) ([place]) 03/24/2012   AAA (abdominal aortic aneurysm) 03/24/2012    Past Medical History:  Diagnosis Date   Arthritis    Atrial fibrillation (HCC)    CHF (congestive heart failure) ([place])    Coronary artery disease    Diabetes mellitus without complication (HCC)    Dyspnea    GERD (gastroesophageal reflux disease)    Hyperlipidemia    Hypertension    Myocardial infarction ([place])    Peptic ulcer disease    Sleep apnea      Transfusion:    Consultants (if any):   Discharged Condition: Improved  Hospital Course: Jon Mcgrath is an 81 y.o. male who was admitted 10/25/2021 with a diagnosis of right knee osteoarthritis and went to the operating room on 10/25/2021 and underwent right total knee arthroplasty. The patient received perioperative antibiotics for prophylaxis (see below). The patient tolerated the procedure well and was transported to PACU in stable condition. After meeting PACU criteria, the patient was subsequently transferred to the Orthopaedics/Rehabilitation unit.   The patient received DVT prophylaxis in the form of early mobilization, Aspirin, TED hose, and Eliquis and SCDs . A sacral pad had been placed and heels were elevated off of the bed with rolled towels in order to protect skin integrity. Foley catheter was discontinued on postoperative day #0. Wound drains were discontinued on postoperative day #1. The surgical incision was healing well without signs of infection.  Physical therapy was initiated postoperatively for transfers, gait training, and strengthening. Occupational therapy was initiated for activities of daily living and evaluation for assisted devices. Rehabilitation goals were reviewed in detail with the patient. The patient made steady  progress with physical therapy and physical therapy recommended  discharge to Home.   The patient achieved the preliminary goals of this hospitalization and was felt to be medically and orthopaedically appropriate for discharge.  He was given perioperative antibiotics:  Anti-infectives (From admission, onward)    Start     Dose/Rate Route Frequency Ordered Stop   10/25/21 1730  ceFAZolin (ANCEF) IVPB 2g/100 mL premix        2 g 200 mL/hr over 30 Minutes Intravenous Every 6 hours 10/25/21 1729 10/25/21 2300   10/25/21 0951  ceFAZolin (ANCEF) 2-4 GM/100ML-% IVPB       Note to Pharmacy: Sylvester Harder P: cabinet override      10/25/21 0951 10/25/21 1856   10/25/21 0600  ceFAZolin (ANCEF) IVPB 2g/100 mL premix        2 g 200 mL/hr over 30 Minutes Intravenous On call to O.R. 10/25/21 0147 10/25/21 1210     .  Recent vital signs:  Vitals:   10/26/21 0000 10/26/21 0400  BP: 120/70 (!) 103/58  Pulse: 70 64  Resp: 16 16  Temp: (!) 96.8 F (36 C) (!) 96.8 F (36 C)  SpO2: 94% 95%    Recent laboratory studies:  No results for input(s): WBC, HGB, HCT, PLT, K, CL, CO2, BUN, CREATININE, GLUCOSE, CALCIUM, LABPT, INR in the last 72 hours.  Diagnostic Studies: DG Knee Right Port  Result Date: 10/25/2021 CLINICAL DATA:  Postoperative for right total knee replacement EXAM: PORTABLE RIGHT KNEE - 1-2 VIEW COMPARISON:  01/16/2016 and MRI from 07/28/2020 FINDINGS: A right total knee arthroplasty is in place without visible periprosthetic fracture or early complicating feature. Gas noted along the soft tissues and a drain is in place. IMPRESSION: 1. Right total knee prosthesis placement without early complicating feature identified. Electronically Signed   By: Van Clines M.D.   On: 10/25/2021 16:00    Discharge Medications:   Allergies as of 10/26/2021   No Known Allergies      Medication List     STOP taking these medications    meloxicam 15 MG tablet Commonly known as: MOBIC       TAKE these medications    amLODipine 5 MG tablet Commonly  known as: NORVASC Take 5 mg by mouth daily.   apixaban 5 MG Tabs tablet Commonly known as: ELIQUIS Take 5 mg by mouth 2 (two) times daily.   aspirin EC 81 MG tablet Take 81 mg by mouth daily. Swallow whole.   atorvastatin 10 MG tablet Commonly known as: LIPITOR Take 10 mg by mouth daily.   carboxymethylcellulose 0.5 % Soln Commonly known as: REFRESH PLUS 1 drop daily with breakfast.   celecoxib 200 MG capsule Commonly known as: CELEBREX Take 1 capsule (200 mg total) by mouth 2 (two) times daily.   colchicine 0.6 MG tablet Take 0.6 mg by mouth 2 (two) times daily as needed.   methocarbamol 500 MG tablet Commonly known as: ROBAXIN Take 500 mg by mouth 2 (two) times daily as needed for muscle spasms.   metoprolol succinate 50 MG 24 hr tablet Commonly known as: TOPROL-XL Take 50 mg by mouth daily.   metoprolol tartrate 25 MG tablet Commonly known as: LOPRESSOR Take 25 mg by mouth every evening.   omeprazole 20 MG capsule Commonly known as: PRILOSEC Take 40 mg by mouth daily.   oxyCODONE 5 MG immediate release tablet Commonly known as: Oxy IR/ROXICODONE Take 1 tablet (5 mg total) by mouth every 4 (four) hours as  needed for moderate pain (pain score 4-6).   temazepam 30 MG capsule Commonly known as: RESTORIL Take 30 mg by mouth at bedtime.   traMADol 50 MG tablet Commonly known as: ULTRAM Take 50 mg by mouth every 6 (six) hours as needed for moderate pain. What changed: Another medication with the same name was added. Make sure you understand how and when to take each.   traMADol 50 MG tablet Commonly known as: ULTRAM Take 1 tablet (50 mg total) by mouth every 4 (four) hours as needed for moderate pain. What changed: You were already taking a medication with the same name, and this prescription was added. Make sure you understand how and when to take each.               Durable Medical Equipment  (From admission, onward)           Start     Ordered    10/25/21 1730  DME Walker rolling  Once       Question:  Patient needs a walker to treat with the following condition  Answer:  Total knee replacement status   10/25/21 1729   10/25/21 1730  DME Bedside commode  Once       Question:  Patient needs a bedside commode to treat with the following condition  Answer:  Total knee replacement status   10/25/21 1729            Disposition: Home with home health PT     Follow-up Information     Fausto Skillern, PA-C Follow up on 11/09/2021.   Specialty: Orthopedic Surgery Why: at 9:45am Contact information: [place] [address] [phone]         Dereck Leep, MD Follow up on 12/07/2021.   Specialty: Orthopedic Surgery Why: at 2:45pm Contact information: [place] [address] [place], PA-C 10/26/2021, 8:21 AM

## 2021-10-26 NOTE — TOC Initial Note (Signed)
Transition of Care [facility]) - Initial/Assessment Note    Patient Details  Name: Jon Mcgrath MRN: [id number] Date of Birth: August 11, 1941  Transition of Care [facility]) CM/SW Contact:    Conception Oms, RN Phone Number: 10/26/2021, 8:47 AM  Clinical Narrative:          The patient is set up with [facility] for [facility] services, The patient will have a 3 in 1 and RW delivered to the room by [employer] prior to DC               Patient Goals and CMS Choice        Expected Discharge Plan and Services                                                Prior Living Arrangements/Services                       Activities of Daily Living Home Assistive Devices/Equipment: Walker (specify type), Blood pressure cuff, CBG Meter, Dentures (specify type) ADL Screening (condition at time of admission) Patient's cognitive ability adequate to safely complete daily activities?: Yes Is the patient deaf or have difficulty hearing?: No Does the patient have difficulty seeing, even when wearing glasses/contacts?: No Does the patient have difficulty concentrating, remembering, or making decisions?: No Patient able to express need for assistance with ADLs?: Yes Does the patient have difficulty dressing or bathing?: No Independently performs ADLs?: Yes (appropriate for developmental age) Does the patient have difficulty walking or climbing stairs?: Yes Weakness of Legs: Right Weakness of Arms/Hands: None  Permission Sought/Granted                  Emotional Assessment              Admission diagnosis:  Total knee replacement status [Z96.659] Patient Active Problem List   Diagnosis Date Noted   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   OSA on CPAP 07/28/2021   Primary osteoarthritis of right knee 07/18/2021   Chronic atrial fibrillation ([place]) 01/26/2021   Moderate to severe pulmonary hypertension ([place]) 12/30/2020   Atherosclerosis of autologous artery coronary artery  bypass graft with stable angina ([place]) 07/24/2020   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 ([place]) 07/24/2020   History of bradycardia 05/16/2019   Lumbar disc disease 09/04/2018   Aortic atherosclerosis ([place]) 05/24/2018   Tubular adenoma 01/11/2017   Benign essential hypertension 03/31/2016   Hyperlipidemia, mixed 02/18/2016   Stage 3b chronic kidney disease ([place]) 03/23/2014   CAD (coronary artery disease) of bypass graft 12/23/2013   S/P CABG x 5 03/30/2012   S/P angioplasty with stent 03/24/2012   HTN (hypertension) 03/24/2012   GERD (gastroesophageal reflux disease) 03/24/2012   NSTEMI (non-ST elevated myocardial infarction) ([place]) 03/24/2012   AAA (abdominal aortic aneurysm) 03/24/2012   PCP:  Rusty Aus, MD Pharmacy:   [facility] [place] [address] [place] [address] Phone: [phone] Fax: [id number] [address], [address] AT [address] [place] [address] Phone: [phone] Fax: [phone]     Social Determinants of Health (SDOH) Interventions    Readmission Risk Interventions No flowsheet data found.

## 2021-10-27 ENCOUNTER — Observation Stay
Admission: EM | Admit: 2021-10-27 | Discharge: 2021-10-29 | Disposition: A | Discharge status: Home / Self Care | Payer: Medicare Other | Attending: Orthopedic Surgery | Admitting: Orthopedic Surgery

## 2021-10-27 ENCOUNTER — Emergency Department: Payer: Medicare Other

## 2021-10-27 ENCOUNTER — Other Ambulatory Visit: Payer: Self-pay

## 2021-10-27 DIAGNOSIS — G8918 Other acute postprocedural pain: Secondary | ICD-10-CM | POA: Diagnosis present

## 2021-10-27 DIAGNOSIS — Z20822 Contact with and (suspected) exposure to covid-19: Secondary | ICD-10-CM | POA: Diagnosis not present

## 2021-10-27 DIAGNOSIS — Z951 Presence of aortocoronary bypass graft: Secondary | ICD-10-CM | POA: Insufficient documentation

## 2021-10-27 DIAGNOSIS — I2510 Atherosclerotic heart disease of native coronary artery without angina pectoris: Secondary | ICD-10-CM | POA: Diagnosis not present

## 2021-10-27 DIAGNOSIS — N1832 Chronic kidney disease, stage 3b: Secondary | ICD-10-CM | POA: Diagnosis not present

## 2021-10-27 DIAGNOSIS — Z7982 Long term (current) use of aspirin: Secondary | ICD-10-CM | POA: Diagnosis not present

## 2021-10-27 DIAGNOSIS — I482 Chronic atrial fibrillation, unspecified: Secondary | ICD-10-CM | POA: Insufficient documentation

## 2021-10-27 DIAGNOSIS — M25561 Pain in right knee: Secondary | ICD-10-CM

## 2021-10-27 DIAGNOSIS — R52 Pain, unspecified: Secondary | ICD-10-CM | POA: Diagnosis present

## 2021-10-27 DIAGNOSIS — I509 Heart failure, unspecified: Secondary | ICD-10-CM | POA: Diagnosis not present

## 2021-10-27 DIAGNOSIS — Z96651 Presence of right artificial knee joint: Secondary | ICD-10-CM | POA: Insufficient documentation

## 2021-10-27 DIAGNOSIS — Z7901 Long term (current) use of anticoagulants: Secondary | ICD-10-CM | POA: Insufficient documentation

## 2021-10-27 DIAGNOSIS — E1122 Type 2 diabetes mellitus with diabetic chronic kidney disease: Secondary | ICD-10-CM | POA: Insufficient documentation

## 2021-10-27 DIAGNOSIS — I130 Hypertensive heart and chronic kidney disease with heart failure and stage 1 through stage 4 chronic kidney disease, or unspecified chronic kidney disease: Secondary | ICD-10-CM | POA: Insufficient documentation

## 2021-10-27 DIAGNOSIS — Z79899 Other long term (current) drug therapy: Secondary | ICD-10-CM | POA: Insufficient documentation

## 2021-10-27 DIAGNOSIS — Z87891 Personal history of nicotine dependence: Secondary | ICD-10-CM | POA: Insufficient documentation

## 2021-10-27 DIAGNOSIS — I4811 Longstanding persistent atrial fibrillation: Secondary | ICD-10-CM

## 2021-10-27 LAB — CBC WITH DIFFERENTIAL/PLATELET
Abs Immature Granulocytes: 0.06 10*3/uL (ref 0.00–0.07)
Basophils Absolute: 0.1 10*3/uL (ref 0.0–0.1)
Basophils Relative: 0 %
Eosinophils Absolute: 0.1 10*3/uL (ref 0.0–0.5)
Eosinophils Relative: 1 %
HCT: 41.4 % (ref 39.0–52.0)
Hemoglobin: 13.3 g/dL (ref 13.0–17.0)
Immature Granulocytes: 1 %
Lymphocytes Relative: 17 %
Lymphs Abs: 2 10*3/uL (ref 0.7–4.0)
MCH: 28.4 pg (ref 26.0–34.0)
MCHC: 32.1 g/dL (ref 30.0–36.0)
MCV: 88.5 fL (ref 80.0–100.0)
Monocytes Absolute: 1.2 10*3/uL — ABNORMAL HIGH (ref 0.1–1.0)
Monocytes Relative: 10 %
Neutro Abs: 8.6 10*3/uL — ABNORMAL HIGH (ref 1.7–7.7)
Neutrophils Relative %: 71 %
Platelets: 210 10*3/uL (ref 150–400)
RBC: 4.68 MIL/uL (ref 4.22–5.81)
RDW: 14.3 % (ref 11.5–15.5)
WBC: 12 10*3/uL — ABNORMAL HIGH (ref 4.0–10.5)
nRBC: 0 % (ref 0.0–0.2)

## 2021-10-27 LAB — CBC
HCT: 39.4 % (ref 39.0–52.0)
Hemoglobin: 12.8 g/dL — ABNORMAL LOW (ref 13.0–17.0)
MCH: 28.9 pg (ref 26.0–34.0)
MCHC: 32.5 g/dL (ref 30.0–36.0)
MCV: 88.9 fL (ref 80.0–100.0)
Platelets: 205 10*3/uL (ref 150–400)
RBC: 4.43 MIL/uL (ref 4.22–5.81)
RDW: 14.2 % (ref 11.5–15.5)
WBC: 11.4 10*3/uL — ABNORMAL HIGH (ref 4.0–10.5)
nRBC: 0 % (ref 0.0–0.2)

## 2021-10-27 LAB — TROPONIN I (HIGH SENSITIVITY): Troponin I (High Sensitivity): 8 ng/L (ref <18)

## 2021-10-27 LAB — BASIC METABOLIC PANEL
Anion gap: 9 (ref 5–15)
BUN: 24 mg/dL — ABNORMAL HIGH (ref 8–23)
CO2: 23 mmol/L (ref 22–32)
Calcium: 8.5 mg/dL — ABNORMAL LOW (ref 8.9–10.3)
Chloride: 107 mmol/L (ref 98–111)
Creatinine, Ser: 1.22 mg/dL (ref 0.61–1.24)
GFR, Estimated: >60 mL/min (ref >60)
Glucose, Bld: 129 mg/dL — ABNORMAL HIGH (ref 70–99)
Potassium: 3.8 mmol/L (ref 3.5–5.1)
Sodium: 139 mmol/L (ref 135–145)

## 2021-10-27 LAB — CREATININE, SERUM
Creatinine, Ser: 1.09 mg/dL (ref 0.61–1.24)
GFR, Estimated: >60 mL/min (ref >60)

## 2021-10-27 LAB — RESP PANEL BY RT-PCR (FLU A&B, COVID) ARPGX2
Influenza A by PCR: NEGATIVE
Influenza B by PCR: NEGATIVE
SARS Coronavirus 2 by RT PCR: NEGATIVE

## 2021-10-27 LAB — BRAIN NATRIURETIC PEPTIDE: B Natriuretic Peptide: 237 pg/mL — ABNORMAL HIGH (ref 0.0–100.0)

## 2021-10-27 MED ORDER — ZOLPIDEM TARTRATE 5 MG PO TABS: 5.0000 mg | ORAL_TABLET | Freq: Every evening | ORAL | Status: DC | PRN

## 2021-10-27 MED ORDER — OXYCODONE HCL 5 MG PO TABS: 5.0000 mg | ORAL_TABLET | ORAL | Status: DC | PRN

## 2021-10-27 MED ORDER — FUROSEMIDE 10 MG/ML IJ SOLN
20.0000 mg | Freq: Once | INTRAMUSCULAR | Status: DC
  Filled 2021-10-27: qty 4

## 2021-10-27 MED ORDER — OXYCODONE HCL ER 10 MG PO T12A
10.0000 mg | EXTENDED_RELEASE_TABLET | Freq: Two times a day (BID) | ORAL | Status: DC
  Administered 2021-10-27: 22:00:00 10 mg via ORAL
  Filled 2021-10-27: qty 1

## 2021-10-27 MED ORDER — ATORVASTATIN CALCIUM 20 MG PO TABS
40.0000 mg | ORAL_TABLET | Freq: Every day | ORAL | Status: DC
  Administered 2021-10-28 – 2021-10-29 (×2): 40 mg via ORAL
  Filled 2021-10-27 (×2): qty 2

## 2021-10-27 MED ORDER — HYDROMORPHONE HCL 1 MG/ML IJ SOLN: 0.5000 mg | INTRAMUSCULAR | Status: DC | PRN

## 2021-10-27 MED ORDER — MORPHINE SULFATE (PF) 4 MG/ML IV SOLN
6.0000 mg | Freq: Once | INTRAVENOUS | Status: AC
  Administered 2021-10-27: 20:00:00 6 mg via INTRAVENOUS
  Filled 2021-10-27: qty 2

## 2021-10-27 MED ORDER — ACETAMINOPHEN 325 MG PO TABS
325.0000 mg | ORAL_TABLET | Freq: Four times a day (QID) | ORAL | Status: DC | PRN
  Administered 2021-10-28: 650 mg via ORAL
  Filled 2021-10-27: qty 2

## 2021-10-27 MED ORDER — ENOXAPARIN SODIUM 60 MG/0.6ML IJ SOSY: 60.0000 mg | PREFILLED_SYRINGE | INTRAMUSCULAR | Status: DC

## 2021-10-27 MED ORDER — ONDANSETRON HCL 4 MG PO TABS
4.0000 mg | ORAL_TABLET | Freq: Four times a day (QID) | ORAL | Status: DC | PRN
  Administered 2021-10-28 (×2): 4 mg via ORAL
  Filled 2021-10-27 (×2): qty 1

## 2021-10-27 MED ORDER — SORBITOL 70 % SOLN
30.0000 mL | Freq: Every day | Status: DC | PRN
  Filled 2021-10-27: qty 30

## 2021-10-27 MED ORDER — ENOXAPARIN SODIUM 40 MG/0.4ML IJ SOSY: 40.0000 mg | PREFILLED_SYRINGE | INTRAMUSCULAR | Status: DC

## 2021-10-27 MED ORDER — TRAMADOL HCL 50 MG PO TABS
50.0000 mg | ORAL_TABLET | Freq: Four times a day (QID) | ORAL | Status: DC
  Administered 2021-10-28 – 2021-10-29 (×5): 50 mg via ORAL
  Filled 2021-10-27 (×5): qty 1

## 2021-10-27 MED ORDER — AMLODIPINE BESYLATE 5 MG PO TABS
5.0000 mg | ORAL_TABLET | Freq: Every day | ORAL | Status: DC
  Administered 2021-10-28 – 2021-10-29 (×2): 5 mg via ORAL
  Filled 2021-10-27 (×2): qty 1

## 2021-10-27 MED ORDER — SENNOSIDES-DOCUSATE SODIUM 8.6-50 MG PO TABS: 1.0000 | ORAL_TABLET | Freq: Every evening | ORAL | Status: DC | PRN

## 2021-10-27 MED ORDER — DOCUSATE SODIUM 100 MG PO CAPS
100.0000 mg | ORAL_CAPSULE | Freq: Two times a day (BID) | ORAL | Status: DC
  Administered 2021-10-27: 22:00:00 100 mg via ORAL
  Filled 2021-10-27: qty 1

## 2021-10-27 MED ORDER — METHOCARBAMOL 500 MG PO TABS
500.0000 mg | ORAL_TABLET | Freq: Four times a day (QID) | ORAL | Status: DC | PRN
  Administered 2021-10-27 – 2021-10-29 (×5): 500 mg via ORAL
  Filled 2021-10-27 (×6): qty 1

## 2021-10-27 MED ORDER — METHOCARBAMOL 1000 MG/10ML IJ SOLN
500.0000 mg | Freq: Four times a day (QID) | INTRAVENOUS | Status: DC | PRN
  Filled 2021-10-27: qty 5

## 2021-10-27 MED ORDER — OXYCODONE HCL 5 MG PO TABS
10.0000 mg | ORAL_TABLET | ORAL | Status: DC | PRN
  Administered 2021-10-28: 15 mg via ORAL
  Filled 2021-10-27: qty 3

## 2021-10-27 MED ORDER — SODIUM CHLORIDE 0.9 % IV SOLN: INTRAVENOUS | Status: DC

## 2021-10-27 MED ORDER — ONDANSETRON HCL 4 MG/2ML IJ SOLN: 4.0000 mg | Freq: Four times a day (QID) | INTRAMUSCULAR | Status: DC | PRN

## 2021-10-27 MED ORDER — OXYCODONE-ACETAMINOPHEN 5-325 MG PO TABS
1.0000 | ORAL_TABLET | Freq: Once | ORAL | Status: AC
  Administered 2021-10-27: 17:00:00 1 via ORAL
  Filled 2021-10-27: qty 1

## 2021-10-27 MED ORDER — METOPROLOL TARTRATE 25 MG PO TABS
25.0000 mg | ORAL_TABLET | Freq: Two times a day (BID) | ORAL | Status: DC
  Administered 2021-10-27 – 2021-10-29 (×4): 25 mg via ORAL
  Filled 2021-10-27 (×4): qty 1

## 2021-10-27 NOTE — Plan of Care (Signed)

## 2021-10-27 NOTE — ED Provider Notes (Signed)
[facility] Provider Note    Event Date/Time   First MD Initiated Contact with Patient 10/27/21 1629     (approximate)   History   Post-op Problem and Knee Pain   HPI  Jon Mcgrath is a 81 y.o. male with a past medical history of A. fib, CHF, CAD, DM, GERD, HTN, HDL, peptic ulcer disease and OSA as well as recent admission and discharged yesterday following an elective right total knee arthroplasty for arthritis who presents for assessment of acute worsening of pain in his right knee radiating up towards the inner right thigh that he states started today.  He has been able to bear weight on the right lower extremity after leaving the hospital feels he has been able to do so today.  No new injuries or twisting.  He states he has felt little short of breath while lying flat and has history of CHF but does not have any shortness of breath while sitting up and thinks this may have started last day or so as well.  He is not on any diuretics.  He has not had any fevers, cough, chills, vomiting, diarrhea, urinary symptoms, back pain or any other extremity pain.  States he resumed his Eliquis after leaving the hospital which he takes for A. fib and he thinks he has had 2 doses since leaving.     Past Medical History:  Diagnosis Date   Arthritis    Atrial fibrillation (HCC)    CHF (congestive heart failure) (HCC)    Coronary artery disease    Diabetes mellitus without complication (HCC)    Dyspnea    GERD (gastroesophageal reflux disease)    Hyperlipidemia    Hypertension    Myocardial infarction ([place])    Peptic ulcer disease    Sleep apnea          Physical Exam  Triage Vital Signs: ED Triage Vitals  Enc Vitals Group     BP      Pulse      Resp      Temp      Temp src      SpO2      Weight      Height      Head Circumference      Peak Flow      Pain Score      Pain Loc      Pain Edu?      Excl. in [place]?     Most recent vital  signs: Vitals:   10/27/21 1629  BP: (!) 184/85  Pulse: 92  Temp: 97.8 F (36.6 C)  SpO2: 97%    General: Awake, no distress.  CV:  Good peripheral perfusion.  2+ bilateral radial and DP pulses.  Slight systolic murmur.  Irregular rate. Resp:  Normal effort.  Clear bilaterally. Abd:  No distention.  Soft throughout. Other:  Clean incision over the anterior aspect of the right knee and some dried blood but no active bleeding or purulence.  Surrounding skin has no induration and is warm and well-perfused.  Patient has sensation intact to light touch throughout.  He is unable to lift his heel off the bed or flex or extend the knee.  He does appear more swollen than the left although there is edema extending proximally to mid thigh and slightly to the ankle as well that is slightly more noticeable than on the left side.   ED Results / Procedures / Treatments  Labs (all labs ordered are listed, but only abnormal results are displayed) Labs Reviewed  CBC WITH DIFFERENTIAL/PLATELET - Abnormal; Notable for the following components:      Result Value   WBC 12.0 (*)    Neutro Abs 8.6 (*)    Monocytes Absolute 1.2 (*)    All other components within normal limits  BASIC METABOLIC PANEL - Abnormal; Notable for the following components:   Glucose, Bld 129 (*)    BUN 24 (*)    Calcium 8.5 (*)    All other components within normal limits  BRAIN NATRIURETIC PEPTIDE - Abnormal; Notable for the following components:   B Natriuretic Peptide 237.0 (*)    All other components within normal limits  RESP PANEL BY RT-PCR (FLU A&B, COVID) ARPGX2  CBC  CREATININE, SERUM  TROPONIN I (HIGH SENSITIVITY)     EKG  EKG is remarkable for A. fib with a ventricular rate of 95 without clear evidence of acute ischemia or significant arrhythmia.  Normal axis.  Otherwise unremarkable intervals.    RADIOLOGY  Chest x-ray reviewed by myself shows some cardiomegaly and appears to be early pulm edema.  No focal  consolidation, large effusion, pneumothorax or other clear acute thoracic process.  Plain film of the right knee ordered and reviewed by myself shows no acute fracture dislocation.  I also reviewed radiology interpretation and agree with findings.   Ultrasound right lower extremity reviewed myself shows no evidence of DVT or other acute process.  Labs reviewed radiology interpretation and agree with the findings.  PROCEDURES:     MEDICATIONS ORDERED IN ED: Medications  furosemide (LASIX) injection 20 mg (has no administration in time range)  morphine 4 MG/ML injection 6 mg (has no administration in time range)  enoxaparin (LOVENOX) injection 40 mg (has no administration in time range)  0.9 %  sodium chloride infusion (has no administration in time range)  acetaminophen (TYLENOL) tablet 325-650 mg (has no administration in time range)  oxyCODONE (Oxy IR/ROXICODONE) immediate release tablet 5-10 mg (has no administration in time range)  oxyCODONE (Oxy IR/ROXICODONE) immediate release tablet 10-15 mg (has no administration in time range)  HYDROmorphone (DILAUDID) injection 0.5-1 mg (has no administration in time range)  methocarbamol (ROBAXIN) tablet 500 mg (has no administration in time range)    Or  methocarbamol (ROBAXIN) 500 mg in dextrose 5 % 50 mL IVPB (has no administration in time range)  zolpidem (AMBIEN) tablet 5 mg (has no administration in time range)  docusate sodium (COLACE) capsule 100 mg (has no administration in time range)  senna-docusate (Senokot-S) tablet 1 tablet (has no administration in time range)  sorbitol 70 % solution 30 mL (has no administration in time range)  ondansetron (ZOFRAN) tablet 4 mg (has no administration in time range)    Or  ondansetron (ZOFRAN) injection 4 mg (has no administration in time range)  traMADol (ULTRAM) tablet 50 mg (has no administration in time range)  oxyCODONE (OXYCONTIN) 12 hr tablet 10 mg (has no administration in time range)   amLODipine (NORVASC) tablet 5 mg (has no administration in time range)  atorvastatin (LIPITOR) tablet 40 mg (has no administration in time range)  metoprolol tartrate (LOPRESSOR) tablet 25 mg (has no administration in time range)  oxyCODONE-acetaminophen (PERCOCET/ROXICET) 5-325 MG per tablet 1 tablet (1 tablet Oral Given 10/27/21 1724)     IMPRESSION / MDM / ASSESSMENT AND PLAN / ED COURSE  I reviewed the triage vital signs and the nursing notes.  Differential diagnosis includes, but is not limited to postop infection, hemarthrosis, routine postop pain, DVT, compartment syndrome, with lower suspicion for an acute fracture or dislocation.  With regard to his orthopnea symptoms certainly sounds like part of mild volume overload.  Is denying any fever or cough and has no findings on his chest x-ray or lung sounds to suggest a bacterial pneumonia.  He is not in any respiratory distress and has no evidence of hypoxia  EKG is remarkable for A. fib with a ventricular rate of 95 without clear evidence of acute ischemia or significant arrhythmia.  Normal axis.  Otherwise unremarkable intervals.   Chest x-ray reviewed by myself shows some cardiomegaly and appears to be early pulm edema.  No focal consolidation, large effusion, pneumothorax or other clear acute thoracic process.  Plain film of the right knee ordered and reviewed by myself shows no acute fracture dislocation.  I also reviewed radiology interpretation and agree with findings.  Ultrasound right lower extremity reviewed myself shows no evidence of DVT or other acute process.  Labs reviewed radiology interpretation and agree with the findings.  CBC shows slight leukocytosis likely reactive in setting of recent surgery with WBC count 12.  Hemoglobin is unremarkable.  BMP without any significant metabolic derangements.  BNP elevated to 237 consistent with mild CHF and findings on chest x-ray.  Patient given  one-time dose of 20 mg of Lasix in the emergency room.  Troponin not suggestive of ACS.  Low suspicion for pneumonia or other acute thoracic process as etiology of patient's without dyspnea at this time.  With regard to the right knee pain I discussed patient's presentation work-up with on-call orthopedist Dr. Rudene Christians is concerned that patient's possible block may have worn off and this is likely causing his uncontrolled pain.  We will place admission orders so the patient can get adequate analgesia and be evaluated the morning by Dr. Marry Guan.  Patient will be admitted as I discussed with Dr. Rudene Christians given some Lasix with concerns for some mild CHF.      FINAL CLINICAL IMPRESSION(S) / ED DIAGNOSES   Final diagnoses:  Acute pain of right knee  Acute on chronic congestive heart failure, unspecified heart failure type (HCC)  Longstanding persistent atrial fibrillation (HCC)  Anticoagulated  Status post total right knee replacement     Rx / DC Orders   ED Discharge Orders     None        Note:  This document was prepared using Dragon voice recognition software and may include unintentional dictation errors.   Lucrezia Starch, MD 10/27/21 [phone]

## 2021-10-27 NOTE — ED Triage Notes (Signed)
Per EMS, Pt, from home, c/o R knee pain and swelling.  Pain score 9/10.  Pt had a R total knee replacement x2 days ago and was discharged yesterday.  Pt has been taking oxycodone and tramadol as prescribed.      Pt continually repeating "there's no way I can go home."  Sts he was unable to get out of his recliner today w/o significant assistance from his daughter.  Daughter does not live w/ him.

## 2021-10-27 NOTE — Progress Notes (Signed)
Pt requests [facility] at this time - does not feel able to go home alone

## 2021-10-28 MED ORDER — CELECOXIB 200 MG PO CAPS
200.0000 mg | ORAL_CAPSULE | Freq: Two times a day (BID) | ORAL | Status: DC
  Administered 2021-10-28 – 2021-10-29 (×3): 200 mg via ORAL
  Filled 2021-10-28 (×3): qty 1

## 2021-10-28 MED ORDER — OXYCODONE HCL 5 MG PO TABS
5.0000 mg | ORAL_TABLET | ORAL | Status: DC | PRN
  Administered 2021-10-28 – 2021-10-29 (×2): 5 mg via ORAL
  Filled 2021-10-28: qty 1

## 2021-10-28 MED ORDER — SENNOSIDES-DOCUSATE SODIUM 8.6-50 MG PO TABS
1.0000 | ORAL_TABLET | Freq: Two times a day (BID) | ORAL | Status: DC
  Administered 2021-10-28 – 2021-10-29 (×3): 1 via ORAL
  Filled 2021-10-28 (×3): qty 1

## 2021-10-28 MED ORDER — OXYCODONE HCL 5 MG PO TABS
10.0000 mg | ORAL_TABLET | ORAL | Status: DC | PRN
  Administered 2021-10-28 – 2021-10-29 (×3): 10 mg via ORAL
  Filled 2021-10-28 (×4): qty 2

## 2021-10-28 MED ORDER — APIXABAN 5 MG PO TABS
5.0000 mg | ORAL_TABLET | Freq: Two times a day (BID) | ORAL | Status: DC
  Administered 2021-10-28 – 2021-10-29 (×3): 5 mg via ORAL
  Filled 2021-10-28 (×3): qty 1

## 2021-10-28 MED ORDER — MAGNESIUM HYDROXIDE 400 MG/5ML PO SUSP
30.0000 mL | Freq: Every day | ORAL | Status: DC
  Administered 2021-10-28 – 2021-10-29 (×2): 30 mL via ORAL
  Filled 2021-10-28 (×2): qty 30

## 2021-10-28 NOTE — TOC Progression Note (Signed)
Transition of Care [facility]) - Progression Note    Patient Details  Name: Naaman Curro MRN: [id number] Date of Birth: [birth year]  [facility]) CM/SW [place], RN Phone Number: 10/28/2021, 12:41 PM  Clinical Narrative:   Met with the patient and explained to him that he will not qualify for Health Ins to cover STR SNF, He lives alone however his daughter is available as much as he needs, He is open with [facility] for [facility] services, He stated that the 3 in 1 he got at the last hospital stay is too small, I explained that Insurance will not cover another one for 5 years, I told him about the Hospice outlet that has DME less expensive than purchasing from a DME company, I offered  to get the price from [employer] that he would have to pay, He declined.  We tried to call his daughter while in the room and she did not answer, he left her a VM asking for her to call him back         Expected Discharge Plan and Services           Expected Discharge Date: 10/29/21                                     Social Determinants of Health (SDOH) Interventions    Readmission Risk Interventions No flowsheet data found.

## 2021-10-28 NOTE — Progress Notes (Signed)
Physical Therapy Treatment Patient Details Name: Jon Mcgrath MRN: [id number] DOB: 08-30-1941 Today's Date: 10/28/2021   History of Present Illness 81 y.o. 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  PMH includes SOB with exertion, sleep apnea with CPAP, htn, pulmonary htn, CAD, h/o MI, CABG x5 2013, CHF, a-fib, DM. Pt d/c home on 10/26/21 and returned to ED 10/27/21 due to intractable pain, swelling and some shortness of breath.    PT Comments    Pt with expected POD3 pain that did not disproportionately limited mobility or participation with PT.  Overall he showed good strength and mobility and activity tolerance.  Performed/educated on multiple transfers in/out of bed.  Pt showing improved tolerance with exercise/strength and was able to circumambulate the nurses' station with relatively consistent and confident gait.    Recommendations for follow up therapy are one component of a multi-disciplinary discharge planning process, led by the attending physician.  Recommendations may be updated based on patient status, additional functional criteria and insurance authorization.  Follow Up Recommendations  Follow physician's recommendations for discharge plan and follow up therapies     Assistance Recommended at Discharge Intermittent Supervision/Assistance  Patient can return home with the following A little help with walking and/or transfers;A little help with bathing/dressing/bathroom;Assistance with cooking/housework;Direct supervision/assist for medications management;Assist for transportation;Help with stairs or ramp for entrance   Equipment Recommendations  None recommended by PT    Recommendations for Other Services       Precautions / Restrictions Precautions Precautions: Fall;Knee Restrictions Weight Bearing Restrictions: Yes RLE Weight Bearing: Weight bearing as tolerated     Mobility  Bed Mobility Overal bed mobility: Modified Independent Bed  Mobility: Supine to Sit, Sit to Supine     Supine to sit: Min guard, Supervision Sit to supine: Min guard, Supervision   General bed mobility comments: sit to supine X 2 and supine to sit X 1.  Pt did struggle to fully get LEs up on and to center of the bed, but did manage to do so without any direct assist.  HOB flat, bed rails down (and unused).  He did need some cuing for appropriate sequencing and strategy but is feeling better about bed mobility.  Will likely continue to sleep in lift recliner initially and we discussed proper positioning and precautions with that as well    Transfers Overall transfer level: Modified independent Equipment used: Rolling walker (2 wheels) Transfers: Sit to/from Stand Sit to Stand: Min guard           General transfer comment: Pt able to place hand and shift weight better and w/o cuing this afternoon. Less heavy forward lean, still very reliant on UEs/arm rests, but did manage to get to standing w/o phyiscal assist from standard height recliner    Ambulation/Gait Ambulation/Gait assistance: Supervision Gait Distance (Feet): 200 Feet Assistive device: Rolling walker (2 wheels)         General Gait Details: Pt again with some initial limp on the R with increased UE reliance on walker but able to improve cadence and weight acceptance/tolerance.  ambulated entire loop w/o sitting, though we did take 2 brief standing rest breaks.   Stairs             Wheelchair Mobility    Modified Rankin (Stroke Patients Only)       Balance Overall balance assessment: Needs assistance Sitting-balance support: No upper extremity supported, Feet supported Sitting balance-Leahy Scale: Normal     Standing balance support: Bilateral  upper extremity supported Standing balance-Leahy Scale: Good Standing balance comment: pt did not have knee buckling or any LOBs during static and dynamic standing.  He was reliant on the walker during standing/gait better  tolerance and confidence with WBing this afternoon                            Cognition Arousal/Alertness: Awake/alert Behavior During Therapy: WFL for tasks assessed/performed Overall Cognitive Status: Within Functional Limits for tasks assessed                                          Exercises Total Joint Exercises Ankle Circles/Pumps: AROM, 10 reps Quad Sets: Strengthening, 10 reps Short Arc Quad: Strengthening, AROM, 10 reps Heel Slides: Strengthening, 10 reps (with resisted leg ext) Hip ABduction/ADduction: 10 reps, Strengthening Straight Leg Raises: AROM, 10 reps (unable to do AROM before warm up exercises, AROM after) Long Arc Quad: Strengthening, 10 reps, Right Knee Flexion: PROM, 5 reps Goniometric ROM: 0-93    General Comments General comments (skin integrity, edema, etc.): Pt reporting fluctuating pain t/o the session, showed reasonable tolerance during gentle exercises, increased pain/hesitancy with WBing      Pertinent Vitals/Pain Pain Assessment Pain Assessment: 0-10 Pain Score: 4  Pain Location: R knee Pain Intervention(s): Limited activity within patient's tolerance    Home Living Family/patient expects to be discharged to:: Private residence Living Arrangements: Alone Available Help at Discharge: Family;Available PRN/intermittently Type of Home: Mobile home Home Access: Stairs to enter Entrance Stairs-Rails: Right;Left;Can reach both Entrance Stairs-Number of Steps: 3-4   Home Layout: One level Home Equipment: Conservation officer, nature (2 wheels);Rollator (4 wheels);Cane - single point;BSC/3in1 Additional Comments: regualr BSC does not fit pts body habitus    Prior Function            PT Goals (current goals can now be found in the care plan section) Acute Rehab PT Goals PT Goal Formulation: With patient/family Time For Goal Achievement: 11/09/21 Potential to Achieve Goals: Good    Frequency    BID      PT Plan  Current plan remains appropriate    Co-evaluation              AM-PAC PT "6 Clicks" Mobility   Outcome Measure  Help needed turning from your back to your side while in a flat bed without using bedrails?: None Help needed moving from lying on your back to sitting on the side of a flat bed without using bedrails?: None Help needed moving to and from a bed to a chair (including a wheelchair)?: None Help needed standing up from a chair using your arms (e.g., wheelchair or bedside chair)?: None Help needed to walk in hospital room?: A Little Help needed climbing 3-5 steps with a railing? : A Little 6 Click Score: 22    End of Session Equipment Utilized During Treatment: Gait belt Activity Tolerance: Patient tolerated treatment well Patient left: with call bell/phone within reach;with family/visitor present;with bed alarm set Nurse Communication: Mobility status PT Visit Diagnosis: Other abnormalities of gait and mobility (R26.89);Muscle weakness (generalized) (M62.81);Pain Pain - Right/Left: Right Pain - part of body: Knee     Time: 1450-1538 PT Time Calculation (min) (ACUTE ONLY): 48 min  Charges:  $Gait Training: 8-22 mins $Therapeutic Exercise: 8-22 mins $Therapeutic Activity: 8-22 mins  Kreg Shropshire, DPT 10/28/2021, 4:21 PM

## 2021-10-28 NOTE — H&P (Addendum)
ORTHOPAEDIC HISTORY & PHYSICAL  PATIENT NAME: Jon Mcgrath DOB: [birth year]  MRN: [id number]  Chief Complaint: intractable pain s/p R TKA 10/25/21  HPI: Jon Mcgrath is a 81 y.o. male who underwent R TKA on 10/25/21. Patient was recovering well and was discharged home on 10/26/21. Patient returned to the ED on 10/27/21 due to intractable pain and some shortness of breath. Patient was given Lasix in the ED for mild CHF exacerbation and admitted for further evaluation and likely placement to rehab facility.   Past Medical History:  Diagnosis Date   Arthritis    Atrial fibrillation (HCC)    CHF (congestive heart failure) ([place])    Coronary artery disease    Diabetes mellitus without complication (HCC)    Dyspnea    GERD (gastroesophageal reflux disease)    Hyperlipidemia    Hypertension    Myocardial infarction ([place])    Peptic ulcer disease    Sleep apnea    Past Surgical History:  Procedure Laterality Date   BALLOON DILATION N/A 07/22/2019   Procedure: BALLOON DILATION;  Surgeon: Toledo, Benay Pike, MD;  Location: [facility];  Service: Gastroenterology;  Laterality: N/A;   CARDIAC CATHETERIZATION     COLONOSCOPY     COLONOSCOPY WITH PROPOFOL N/A 07/22/2019   Procedure: COLONOSCOPY WITH PROPOFOL;  Surgeon: Toledo, Benay Pike, MD;  Location: [facility];  Service: Gastroenterology;  Laterality: N/A;   CORONARY ARTERY BYPASS GRAFT  03/26/2012   Procedure: CORONARY ARTERY BYPASS GRAFTING (CABG);  Surgeon: Grace Isaac, MD;  Location: [facility];  Service: Open Heart Surgery;  Laterality: N/A;  coronary artery bypass grafting times five using left internal mammary artery and right saphenous leg vein   ENDOVEIN HARVEST OF GREATER SAPHENOUS VEIN  03/26/2012   Procedure: ENDOVEIN HARVEST OF GREATER SAPHENOUS VEIN;  Surgeon: Grace Isaac, MD;  Location: [facility];  Service: Open Heart Surgery;  Laterality: N/A;  endoscopic harvesting of right saphenous leg vein    ESOPHAGOGASTRODUODENOSCOPY (EGD) WITH PROPOFOL N/A 07/22/2019   Procedure: ESOPHAGOGASTRODUODENOSCOPY (EGD) WITH PROPOFOL;  Surgeon: Toledo, Benay Pike, MD;  Location: [facility];  Service: Gastroenterology;  Laterality: N/A;   FRACTURE SURGERY Right    arm   HERNIA REPAIR     KNEE ARTHROPLASTY Right 10/25/2021   Procedure: COMPUTER ASSISTED TOTAL KNEE ARTHROPLASTY;  Surgeon: Dereck Leep, MD;  Location: [facility];  Service: Orthopedics;  Laterality: Right;   Social History   Socioeconomic History   Marital status: Widowed    Spouse name: Not on file   Number of children: Not on file   Years of education: Not on file   Highest education level: Not on file  Occupational History   Not on file  Tobacco Use   Smoking status: Former    Types: Cigarettes    Quit date: 10/03/2005    Years since quitting: 16.0   Smokeless tobacco: Never  Vaping Use   Vaping Use: Never used  Substance and Sexual Activity   Alcohol use: Not Currently   Drug use: No   Sexual activity: Yes  Other Topics Concern   Not on file  Social History Narrative   Not on file   Social Determinants of Health   Financial Resource Strain: Not on file  Food Insecurity: Not on file  Transportation Needs: Not on file  Physical Activity: Not on file  Stress: Not on file  Social Connections: Not on file   Family History  Problem Relation Age of Onset  Heart disease Father    No Known Allergies Prior to Admission medications   Medication Sig Start Date End Date Taking? Authorizing Provider  amLODipine (NORVASC) 5 MG tablet Take 5 mg by mouth daily. 06/12/21  Yes [provider]  apixaban (ELIQUIS) 5 MG TABS tablet Take 5 mg by mouth 2 (two) times daily.   Yes [provider]  aspirin EC 81 MG tablet Take 81 mg by mouth daily. Swallow whole.   Yes [provider]  atorvastatin (LIPITOR) 10 MG tablet Take 10 mg by mouth daily.   Yes [provider]  celecoxib (CELEBREX) 200  MG capsule Take 1 capsule (200 mg total) by mouth 2 (two) times daily. 10/26/21  Yes Tamala Julian B, PA-C  colchicine 0.6 MG tablet Take 0.6 mg by mouth 2 (two) times daily as needed. 01/26/21  Yes [provider]  metoprolol succinate (TOPROL-XL) 50 MG 24 hr tablet Take by mouth daily. 50mg  in the morning and 25mg  in the evening 09/05/21  Yes [provider]  omeprazole (PRILOSEC) 20 MG capsule Take 40 mg by mouth daily.   Yes [provider]  oxyCODONE (OXY IR/ROXICODONE) 5 MG immediate release tablet Take 1 tablet (5 mg total) by mouth every 4 (four) hours as needed for moderate pain (pain score 4-6). 10/26/21  Yes Tamala Julian B, PA-C  temazepam (RESTORIL) 30 MG capsule Take 30 mg by mouth at bedtime. 08/07/20  Yes [provider]  traMADol (ULTRAM) 50 MG tablet Take 1 tablet (50 mg total) by mouth every 4 (four) hours as needed for moderate pain. 10/26/21  Yes Fausto Skillern, PA-C  carboxymethylcellulose (REFRESH PLUS) 0.5 % SOLN 1 drop daily with breakfast.    [provider]  methocarbamol (ROBAXIN) 500 MG tablet Take 500 mg by mouth 2 (two) times daily as needed for muscle spasms.    [provider]  metoprolol tartrate (LOPRESSOR) 25 MG tablet Take 25 mg by mouth every evening. Patient not taking: Reported on 10/27/2021    [provider]  traMADol (ULTRAM) 50 MG tablet Take 50 mg by mouth every 6 (six) hours as needed for moderate pain.    [provider]   US Venous Img Lower Unilateral Right  Result Date: 10/27/2021 CLINICAL DATA:  Pain and swelling, shortness of breath EXAM: Right LOWER EXTREMITY VENOUS DOPPLER ULTRASOUND TECHNIQUE: Gray-scale sonography with compression, as well as color and duplex ultrasound, were performed to evaluate the deep venous system(s) from the level of the common femoral vein through the popliteal and proximal calf veins. COMPARISON:  None. FINDINGS: VENOUS Normal compressibility of the  common femoral, superficial femoral, and popliteal veins, as well as the visualized calf veins. Visualized portions of profunda femoral vein and great saphenous vein unremarkable. No filling defects to suggest DVT on grayscale or color Doppler imaging. Doppler waveforms show normal direction of venous flow, normal respiratory plasticity and response to augmentation. Limited views of the contralateral common femoral vein are unremarkable. OTHER None. Limitations: none IMPRESSION: There is no evidence of deep venous thrombosis in the right lower extremity. Electronically Signed   By: Elmer Picker M.D.   On: 10/27/2021 17:40   DG Chest Portable 1 View  Result Date: 10/27/2021 CLINICAL DATA:  Shortness of breath. EXAM: PORTABLE CHEST 1 VIEW COMPARISON:  04/23/2012 FINDINGS: Mildly enlarged cardiac silhouette with an interval increase in size. Mild increase in prominence of the interstitial markings with no Kerley lines. Normal vasculature. No pleural fluid. Stable post CABG  changes. Lower thoracic spine degenerative changes. IMPRESSION: Interval mild cardiomegaly and mild chronic interstitial lung disease. Electronically Signed   By: Claudie Revering M.D.   On: 10/27/2021 17:05   DG Knee Complete 4 Views Right  Result Date: 10/27/2021 CLINICAL DATA:  Follow-up right knee replacement. EXAM: RIGHT KNEE - COMPLETE 4+ VIEW COMPARISON:  10/25/2021 FINDINGS: Stable right total knee prosthesis in satisfactory position and alignment. No fracture or dislocation. Stable overlying skin clips. The surgical drains have been removed. Vascular clips in the medial right lower leg are noted. There are also atheromatous arterial calcifications in the distal thigh. IMPRESSION: Stable right knee prosthesis with no complicating features. Electronically Signed   By: Claudie Revering M.D.   On: 10/27/2021 17:03    Positive ROS: All other systems have been reviewed and were otherwise negative with the exception of those mentioned in  the HPI and as above.  Physical Exam: General: Well developed, well nourished male seen in no acute distress. HEENT: Atraumatic and normocephalic. Sclera are clear. Extraocular motion is intact. Oropharynx is clear with moist mucosa. Neck: Supple, nontender, good range of motion.  Lungs: Clear to auscultation bilaterally. Cardiovascular: Regular rate and rhythm with normal S1 and S2. No murmurs. No gallops or rubs. Pedal pulses are palpable bilaterally. Homans test is negative bilaterally. No significant pretibial or ankle edema. Abdomen: Soft, nontender, and nondistended. Bowel sounds are present. Skin: No lesions in the area of chief complaint Neurologic: N/v intact to RLE, able to plantarflex and dorsiflex ankle, flex and extend the toes    MUSCULOSKELETAL: Honeycomb dressing remains in place with no significant drainage. Mini compression dressing remains in place. Able to perform SLR with assistance. No TED hose in place to either leg.    Assessment: -s/p R TKA 10/25/21 -intractable pain Knee pain is well-controlled at this time. Patient states he does not believe he is able to go home as his children are not able to take care of him and he is unable to take care of himself. He was not able to manage his pain at home.  -CHF exacerbation Appears resolved, no further intervention at this time.   Plan: Patient will be admitted. Orders placed for PT,OT eval. As the patient has failed to safely go home and perform ADLS and was unable to control pain, communicated with Care Management to initiate placement to rehab or skilled nursing facility. Pain management orders optimized. Nursing instructed to placed TED hose on patient.   Tamala Julian, PA-C

## 2021-10-28 NOTE — Evaluation (Addendum)
Physical Therapy Evaluation Patient Details Name: Jon Mcgrath MRN: [id number] DOB: Aug 08, 1941 Today's Date: 10/28/2021  History of Present Illness  81 y.o. 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  PMH includes SOB with exertion, sleep apnea with CPAP, htn, pulmonary htn, CAD, h/o MI, CABG x5 2013, CHF, a-fib, DM. Pt d/c home on 10/26/21 and returned to ED 10/27/21 due to intractable pain, swelling and some shortness of breath.  Clinical Impression  Pt in recliner on arrival, reports he had a very hard time managing at home and had excessive swelling and pain that necessitated coming back to the hospital.  Overall he did relatively well per typical POD3 expectations.  He was able to rise from recliner, though highly reliant on the walker and shifting/leaning weight forward, he was able to engage quad reasonably well and did most exercises with light resistance, though  did struggle with AROM SLR movement in recliner.  Pt ambulated ~75 ft with slow but stable gait, c/o fatigue and increased pain (O2 and HR stable and appropriate with the effort).  Pt with appropriate POD3 ROM (>90 of flexion) w/ expected pain.     Recommendations for follow up therapy are one component of a multi-disciplinary discharge planning process, led by the attending physician.  Recommendations may be updated based on patient status, additional functional criteria and insurance authorization.  Follow Up Recommendations Follow physician's recommendations for discharge plan and follow up therapies    Assistance Recommended at Discharge Intermittent Supervision/Assistance  Patient can return home with the following  A little help with walking and/or transfers;A little help with bathing/dressing/bathroom;Assistance with cooking/housework;Direct supervision/assist for financial management;Direct supervision/assist for medications management;Assist for transportation;Help with stairs or ramp for  entrance    Equipment Recommendations Bariatric BSC/3in1  Recommendations for Other Services       Functional Status Assessment Patient has had a recent decline in their functional status and demonstrates the ability to make significant improvements in function in a reasonable and predictable amount of time.     Precautions / Restrictions Precautions Precautions: Fall;Knee Restrictions Weight Bearing Restrictions: Yes RLE Weight Bearing: Weight bearing as tolerated      Mobility  Bed Mobility               General bed mobility comments: Pt in recliner on arrival, in recliner post session    Transfers Overall transfer level: Needs assistance Equipment used: Rolling walker (2 wheels) Transfers: Sit to/from Stand Sit to Stand: Min guard           General transfer comment: Cuing for set up and sequencing.  Pt needed heavy forward lean and reliance on UEs/arm rests, but did manage to get to standing w/o dphyiscal assist from standard height recliner    Ambulation/Gait Ambulation/Gait assistance: Supervision Gait Distance (Feet): 75 Feet Assistive device: Rolling walker (2 wheels)         General Gait Details: Pt with typical initial hesitancy with WBing on surgical leg.  He continued to have minimal limp t/o the effort, but managed to maintain slow but consistent walker momentum.  Did not rely excessively on UEs; O2 (on room air) remained in the mid 90s t/o session, HR 100-120  Stairs            Wheelchair Mobility    Modified Rankin (Stroke Patients Only)       Balance Overall balance assessment: Needs assistance Sitting-balance support: No upper extremity supported, Feet supported Sitting balance-Leahy Scale: Normal  Standing balance support: b/l extremity supported Standing balance-Leahy Scale: Good Standing balance comment: pt did not have knee buckling or any LOBs during static and dynamic standing.  He was reliant on the walker during  standing/gait but appeared hesistant (pain) with trusting R LE during WBing                             Pertinent Vitals/Pain Pain Assessment Pain Assessment: 0-10 Pain Score: 3  (increases to 7/10 during exercises/ambulation) Pain Location: R knee Pain Intervention(s): Limited activity within patient's tolerance    Home Living Family/patient expects to be discharged to:: Private residence Living Arrangements: Alone Available Help at Discharge: Family;Available PRN/intermittently Type of Home: Mobile home Home Access: Stairs to enter Entrance Stairs-Rails: Right;Left;Can reach both Entrance Stairs-Number of Steps: 3-4   Home Layout: One level Home Equipment: Conservation officer, nature (2 wheels);Rollator (4 wheels);Cane - single point;BSC/3in1 Additional Comments: regualr BSC does not fit pts body habitus    Prior Function Prior Level of Function : Independent/Modified Independent             Mobility Comments: prior to surgery used RW in home and 4ww in community; occasional SPC. reports no recent falls. Since d/c home pt has been using lift chair and struggling to get around in the home ADLs Comments: reports being ind in all aspects of care, IADLs, and driving prior to sx. Since d/c daughter assisting for all ADLs     Hand Dominance   Dominant Hand: Right    Extremity/Trunk Assessment   Upper Extremity Assessment Upper Extremity Assessment: Overall WFL for tasks assessed    Lower Extremity Assessment Lower Extremity Assessment: Generalized weakness RLE Deficits / Details: Pt showed AROM in all planes, pain guarded with most acts.  Struggled with against gravity SLR needing light AAROM       Communication   Communication: No difficulties  Cognition Arousal/Alertness: Awake/alert Behavior During Therapy: WFL for tasks assessed/performed Overall Cognitive Status: Within Functional Limits for tasks assessed                                           General Comments General comments (skin integrity, edema, etc.): Pt reporting fluctuating pain t/o the session, showed reasonable tolerance during gentle exercises, increased pain/hesitancy with WBing    Exercises Total Joint Exercises Ankle Circles/Pumps: Both, 10 reps, Supine, AROM Quad Sets: Strengthening, 10 reps, Right Heel Slides: AROM, 5 reps Hip ABduction/ADduction: 10 reps, Right, Strengthening Straight Leg Raises: AAROM, 5 reps, Right Long Arc Quad: Strengthening, 10 reps, Right Knee Flexion: PROM, 5 reps Goniometric ROM: 0-93   Assessment/Plan    PT Assessment Patient needs continued PT services  PT Problem List Decreased strength;Decreased range of motion;Decreased activity tolerance;Decreased balance;Decreased mobility;Decreased knowledge of use of DME;Decreased knowledge of precautions;Pain;Decreased skin integrity       PT Treatment Interventions DME instruction;Gait training;Stair training;Functional mobility training;Therapeutic activities;Therapeutic exercise;Balance training;Patient/family education    PT Goals (Current goals can be found in the Care Plan section)  Acute Rehab PT Goals Patient Stated Goal: to go home today PT Goal Formulation: With patient/family Time For Goal Achievement: 11/09/21 Potential to Achieve Goals: Good    Frequency BID     Co-evaluation               AM-PAC PT "6 Clicks" Mobility  Outcome Measure Help  needed turning from your back to your side while in a flat bed without using bedrails?: None Help needed moving from lying on your back to sitting on the side of a flat bed without using bedrails?: None Help needed moving to and from a bed to a chair (including a wheelchair)?: None Help needed standing up from a chair using your arms (e.g., wheelchair or bedside chair)?: None Help needed to walk in hospital room?: A Little Help needed climbing 3-5 steps with a railing? : A Little 6 Click Score: 22    End of  Session Equipment Utilized During Treatment: Gait belt Activity Tolerance: Patient tolerated treatment well Patient left: with chair alarm set;with call bell/phone within reach;with family/visitor present Nurse Communication: Mobility status PT Visit Diagnosis: Other abnormalities of gait and mobility (R26.89);Muscle weakness (generalized) (M62.81);Pain Pain - Right/Left: Right Pain - part of body: Knee    Time: 1105-1140 PT Time Calculation (min) (ACUTE ONLY): 35 min   Charges:   PT Evaluation $PT Eval Low Complexity: 1 Low PT Treatments $Gait Training: 8-22 mins $Therapeutic Exercise: 8-22 mins        Kreg Shropshire, DPT 10/28/2021, 1:49 PM

## 2021-10-28 NOTE — Evaluation (Addendum)
Occupational Therapy Evaluation Patient Details Name: Jon Mcgrath MRN: [id number] DOB: [birth year] Today's Date: 10/28/2021   History of Present Illness Pt is a 81 y.o. 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  PMH includes SOB with exertion, sleep apnea with CPAP, htn, pulmonary htn, CAD, h/o MI, CABG x5 2013, CHF, a-fib, DM. Pt d/c home on 10/26/21. Patient returned to the ED on 10/27/21 due to intractable pain and some shortness of breath.   Clinical Impression   Jon Mcgrath was seen for OT evaluation this date. Prior to hospital admission, pt was MOD I for mobility and ALDs using RW, pt recently d/c from hospital using RW. Pt lives alone with daughter nearby, available for non physical assist only. Pt presents to acute OT demonstrating impaired ADL performance and functional mobility 2/2 decreased activity tolerance and functional strength/ROM/balance deficits. Pt currently requires MOD A don B socks seated EOB. CGA + RW for BSC t/f and ~15 ft in room mobility. SETUP perihygiene with lateral leans. Requires MOD cues to sequence all transfers, limited by pain and decreased knee AROM. SpO2 97% on RA during mobility, increased WOB noted. Pt would benefit from skilled OT to address noted impairments and functional limitations (see below for any additional details). Upon hospital discharge, recommend STR to maximize pt safety and return to PLOF. Of note, pt body habitus does not fit in regular BSC, will require bariatric BSC.       Recommendations for follow up therapy are one component of a multi-disciplinary discharge planning process, led by the attending physician.  Recommendations may be updated based on patient status, additional functional criteria and insurance authorization.   Follow Up Recommendations  Skilled nursing-short term rehab (<3 hours/day)    Assistance Recommended at Discharge Intermittent Supervision/Assistance  Patient can return home with the following  Assistance with cooking/housework;Help with stairs or ramp for entrance;A little help with walking and/or transfers;A lot of help with bathing/dressing/bathroom    Functional Status Assessment  Patient has had a recent decline in their functional status and demonstrates the ability to make significant improvements in function in a reasonable and predictable amount of time.  Equipment Recommendations  Other (comment) (bariatric BSC)    Recommendations for Other Services       Precautions / Restrictions Precautions Precautions: Fall;Knee Restrictions Weight Bearing Restrictions: Yes RLE Weight Bearing: Weight bearing as tolerated      Mobility Bed Mobility Overal bed mobility: Modified Independent             General bed mobility comments: HOB elevated, increased time and effort with rest breaks    Transfers Overall transfer level: Needs assistance Equipment used: Rolling walker (2 wheels) Transfers: Sit to/from Stand Sit to Stand: Min guard           General transfer comment: SBA + MOD cues for proper technique      Balance Overall balance assessment: Needs assistance Sitting-balance support: No upper extremity supported, Feet supported Sitting balance-Leahy Scale: Normal     Standing balance support: No upper extremity supported Standing balance-Leahy Scale: Good Standing balance comment: no UE support reaching in side BOS                           ADL either performed or assessed with clinical judgement   ADL Overall ADL's : Needs assistance/impaired  General ADL Comments: MOD A don B socks seated EOB. CGA + RW for BSC t/f and ~15 ft in room mobility. SETUP perihygiene with lateral leans. Requires MOD cues to sequence all transfers.       Pertinent Vitals/Pain Pain Assessment Pain Assessment: 0-10 Pain Score: 6  Pain Location: R knee Pain Descriptors / Indicators: Discomfort, Dull,  Grimacing Pain Intervention(s): Limited activity within patient's tolerance, Premedicated before session, Repositioned, RN gave pain meds during session     Hand Dominance Right   Extremity/Trunk Assessment Upper Extremity Assessment Upper Extremity Assessment: Overall WFL for tasks assessed   Lower Extremity Assessment Lower Extremity Assessment: Generalized weakness       Communication Communication Communication: No difficulties   Cognition Arousal/Alertness: Awake/alert Behavior During Therapy: WFL for tasks assessed/performed Overall Cognitive Status: Within Functional Limits for tasks assessed                                       General Comments  SpO2 97% on RA, increased WOB noted            Home Living Family/patient expects to be discharged to:: Private residence Living Arrangements: Alone Available Help at Discharge: Family;Available PRN/intermittently Type of Home: Mobile home Home Access: Stairs to enter Entrance Stairs-Number of Steps: 3-4 Entrance Stairs-Rails: Right;Left;Can reach both Home Layout: One level     Bathroom Shower/Tub: Teacher, early years/pre: Standard     Home Equipment: Conservation officer, nature (2 wheels);Rollator (4 wheels);Cane - single point;BSC/3in1   Additional Comments: regualr BSC does not fit pts body habitus      Prior Functioning/Environment Prior Level of Function : Independent/Modified Independent             Mobility Comments: Ambulatory with RW in home and 4ww in community; was using SPC. Pt reports no recent falls. Since d/c home pt has been using lift chair ADLs Comments: Pt reports being ind in all aspects of care, IADLs, and driving. Since d/c home assist for all ADLs        OT Problem List: Decreased strength;Decreased range of motion;Decreased activity tolerance;Impaired balance (sitting and/or standing)      OT Treatment/Interventions: Self-care/ADL training;Therapeutic  exercise;Energy conservation;DME and/or AE instruction;Therapeutic activities;Patient/family education;Balance training    OT Goals(Current goals can be found in the care plan section) Acute Rehab OT Goals Patient Stated Goal: to improve pain OT Goal Formulation: With patient/family Time For Goal Achievement: 11/11/21 Potential to Achieve Goals: Good ADL Goals Pt Will Perform Grooming: with modified independence;standing Pt Will Perform Lower Body Dressing: with min guard assist;with caregiver independent in assisting;sit to/from stand Pt Will Transfer to Toilet: with modified independence;ambulating;regular height toilet  OT Frequency: Min 2X/week    Co-evaluation              AM-PAC OT "6 Clicks" Daily Activity     Outcome Measure Help from another person eating meals?: None Help from another person taking care of personal grooming?: A Little Help from another person toileting, which includes using toliet, bedpan, or urinal?: None Help from another person bathing (including washing, rinsing, drying)?: A Little Help from another person to put on and taking off regular upper body clothing?: None Help from another person to put on and taking off regular lower body clothing?: A Lot 6 Click Score: 20   End of Session Nurse Communication: Mobility status  Activity Tolerance: Patient tolerated treatment  well Patient left: in chair;with call bell/phone within reach;with chair alarm set;with family/visitor present  OT Visit Diagnosis: Other abnormalities of gait and mobility (R26.89);Muscle weakness (generalized) (M62.81)                Time: 3818-2993 OT Time Calculation (min): 38 min Charges:  OT General Charges $OT Visit: 1 Visit OT Evaluation $OT Eval Low Complexity: 1 Low OT Treatments $Self Care/Home Management : 23-37 mins  Dessie Coma, M.S. OTR/L  10/28/21, 12:47 PM  ascom [phone]

## 2021-10-28 NOTE — TOC Initial Note (Signed)
Transition of Care [facility]) - Initial/Assessment Note    Patient Details  Name: Jon Mcgrath MRN: [id number] Date of Birth: Oct 11, 1940  Transition of [facility]) CM/SW Contact:    Conception Oms, RN Phone Number: 10/28/2021, 8:34 AM  Clinical Narrative:                The patient is from home alone, e wants to go to [facility], However, he walked 75 ft with PT and with [employer], his daughter comes by and helps often, he has DME at home including a cane, a rolling walker and a rolator         Patient Goals and CMS Choice        Expected Discharge Plan and Services           Expected Discharge Date: 10/29/21                                    Prior Living Arrangements/Services                       Activities of Daily Living Home Assistive Devices/Equipment: Gilford Rile (specify type) ADL Screening (condition at time of admission) Patient's cognitive ability adequate to safely complete daily activities?: Yes Is the patient deaf or have difficulty hearing?: No Does the patient have difficulty seeing, even when wearing glasses/contacts?: No Does the patient have difficulty concentrating, remembering, or making decisions?: No Patient able to express need for assistance with ADLs?: Yes Does the patient have difficulty dressing or bathing?: No Independently performs ADLs?: Yes (appropriate for developmental age) Does the patient have difficulty walking or climbing stairs?: Yes Weakness of Legs: Right Weakness of Arms/Hands: None  Permission Sought/Granted                  Emotional Assessment              Admission diagnosis:  Anticoagulated [Z79.01] Intractable pain [R52] Acute pain of right knee [M25.561] Status post total right knee replacement [Z96.651] Longstanding persistent atrial fibrillation ([place]) [I48.11] Acute on chronic congestive heart failure, unspecified heart failure type ([place]) [I50.9] Patient Active Problem  List   Diagnosis Date Noted   Intractable pain 10/27/2021   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   OSA on CPAP 07/28/2021   Primary osteoarthritis of right knee 07/18/2021   Chronic atrial fibrillation ([place]) 01/26/2021   Moderate to severe pulmonary hypertension ([place]) 12/30/2020   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 ([place]) 07/24/2020   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 ([place]) 07/24/2020   History of bradycardia 05/16/2019   Lumbar disc disease 09/04/2018   Aortic atherosclerosis ([place]) 05/24/2018   Tubular adenoma 01/11/2017   Benign essential hypertension 03/31/2016   Hyperlipidemia, mixed 02/18/2016   Stage 3b chronic kidney disease ([place]) 03/23/2014   CAD (coronary artery disease) of bypass graft 12/23/2013   S/P CABG x 5 03/30/2012   S/P angioplasty with stent 03/24/2012   HTN (hypertension) 03/24/2012   GERD (gastroesophageal reflux disease) 03/24/2012   NSTEMI (non-ST elevated myocardial infarction) ([place]) 03/24/2012   AAA (abdominal aortic aneurysm) 03/24/2012   PCP:  Rusty Aus, MD Pharmacy:   [facility] - [place], [place] - [address] [place] [address] Phone: [phone] Fax: [place] AT  [place] [address] [address] Phone: [phone] Fax: [phone]     Social Determinants of Health (SDOH) Interventions    Readmission Risk Interventions No flowsheet data found.

## 2021-10-29 NOTE — Progress Notes (Signed)
Physical Therapy Treatment Patient Details Name: Jon Mcgrath MRN: [id number] DOB: [birth year] Today's Date: 10/29/2021   History of Present Illness 81 y.o. 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  PMH includes SOB with exertion, sleep apnea with CPAP, htn, pulmonary htn, CAD, h/o MI, CABG x5 2013, CHF, a-fib, DM. Pt d/c home on 10/26/21 and returned to ED 10/27/21 due to intractable pain, swelling and some shortness of breath.    PT Comments    Pt was finishing OT session upon arriving. Supportive daughter present throughout both sessions. He is A and O x 4. Endorses being fearful of falling. Session spent performing routine transfers/gait activities to improve pt's confidence. He was safely and easily able to stand + ambulate with RW. Performed stairs with BUE support without physical assistance. Pt is doing extremely well form a PT standpoint. Demonstrates 96 degrees active knee flexion.  He is cleared from an acute PT standpoint to safely DC home with HHPT to follow. Will continue to benefit from skilled PT to address strength and ROM deficits while building pt's confidence with all ADLs.   Recommendations for follow up therapy are one component of a multi-disciplinary discharge planning process, led by the attending physician.  Recommendations may be updated based on patient status, additional functional criteria and insurance authorization.  Follow Up Recommendations  Home health PT     Assistance Recommended at Discharge PRN  Patient can return home with the following A little help with walking and/or transfers;A little help with bathing/dressing/bathroom;Assistance with cooking/housework;Direct supervision/assist for medications management;Assist for transportation;Help with stairs or ramp for entrance   Equipment Recommendations  None recommended by PT       Precautions / Restrictions Precautions Precautions: Fall;Knee Precaution Booklet Issued: Yes  (comment) Restrictions Weight Bearing Restrictions: Yes RLE Weight Bearing: Weight bearing as tolerated     Mobility  Bed Mobility    General bed mobility comments: pt was up with OT at beginning of PT session. bed mobility was not formally tested.    Transfers Overall transfer level: Modified independent Equipment used: Rolling walker (2 wheels) Transfers: Sit to/from Stand Sit to Stand: Supervision           General transfer comment: Pt was able to stand from lowest bed height without physical assistance or Vcs. He demonstrated safe ability to perform.    Ambulation/Gait Ambulation/Gait assistance: Supervision Gait Distance (Feet): 200 Feet Assistive device: Rolling walker (2 wheels) Gait Pattern/deviations: Step-through pattern, Antalgic Gait velocity: decreased     General Gait Details: Pt was safely and easily able to ambulate 200 ft with RW. no LOB or safety concern.   Stairs Stairs: Yes Stairs assistance: Supervision Stair Management: Step to pattern, Two rails Number of Stairs: 4 General stair comments: Pt demonstrated safe ability to ascend/descend 4 stair without assistance or Vcs. Pt states he feels confident in ability to perform at DC.     Balance Overall balance assessment: Needs assistance Sitting-balance support: No upper extremity supported, Feet supported Sitting balance-Leahy Scale: Normal     Standing balance support: Bilateral upper extremity supported Standing balance-Leahy Scale: Good Standing balance comment: pt did not have knee buckling or any LOBs during static and dynamic standing.  He was reliant on the walker during standing/gait better tolerance and confidence with WBing this afternoon     Cognition Arousal/Alertness: Awake/alert Behavior During Therapy: WFL for tasks assessed/performed Overall Cognitive Status: Within Functional Limits for tasks assessed    General Comments: Pt is A and  O x 4        Exercises Total Joint  Exercises Goniometric ROM: 0-96    General Comments General comments (skin integrity, edema, etc.): Lengthy education with pt and daughter about DC recommendations. Both state understanding and state they are confident about DC home. MD and Pryor Curia discussed pt's case. He will be DC home this afternoon with HHPT to follow.      Pertinent Vitals/Pain Pain Assessment Pain Assessment: 0-10 Pain Score: 3  Pain Location: R knee Pain Descriptors / Indicators: Discomfort, Dull, Grimacing Pain Intervention(s): Limited activity within patient's tolerance, Monitored during session, Premedicated before session, Repositioned     PT Goals (current goals can now be found in the care plan section) Acute Rehab PT Goals Patient Stated Goal: go home Progress towards PT goals: Progressing toward goals    Frequency    BID      PT Plan Current plan remains appropriate       AM-PAC PT "6 Clicks" Mobility   Outcome Measure  Help needed turning from your back to your side while in a flat bed without using bedrails?: None Help needed moving from lying on your back to sitting on the side of a flat bed without using bedrails?: None Help needed moving to and from a bed to a chair (including a wheelchair)?: None Help needed standing up from a chair using your arms (e.g., wheelchair or bedside chair)?: None Help needed to walk in hospital room?: A Little Help needed climbing 3-5 steps with a railing? : A Little 6 Click Score: 22    End of Session   Activity Tolerance: Patient tolerated treatment well Patient left: with call bell/phone within reach;with family/visitor present;with bed alarm set Nurse Communication: Mobility status PT Visit Diagnosis: Other abnormalities of gait and mobility (R26.89);Muscle weakness (generalized) (M62.81);Pain Pain - Right/Left: Right Pain - part of body: Knee     Time: 0093-8182 PT Time Calculation (min) (ACUTE ONLY): 24 min  Charges:  $Gait Training: 8-22  mins $Therapeutic Activity: 8-22 mins          Julaine Fusi PTA 10/29/21, 12:39 PM

## 2021-10-29 NOTE — Discharge Summary (Signed)
Physician Discharge Summary  Patient ID: Jon Mcgrath MRN: [id number] DOB/AGE: Mar 26, 1941 81 y.o.  Admit date: 10/27/2021 Discharge date: 10/29/2021  Admission Diagnoses: Anticoagulated [Z79.01] Intractable pain [R52] Acute pain of right knee [M25.561] Status post total right knee replacement [Z96.651] Longstanding persistent atrial fibrillation (HCC) [I48.11] Acute on chronic congestive heart failure, unspecified heart failure type [facility]) [I50.9]    Discharge Diagnoses: Patient Active Problem List   Diagnosis Date Noted   Intractable pain 10/27/2021   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   OSA on CPAP 07/28/2021   Primary osteoarthritis of right knee 07/18/2021   Chronic atrial fibrillation ([place]) 01/26/2021   Moderate to severe pulmonary hypertension ([place]) 12/30/2020   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 ([place]) 07/24/2020   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 ([place]) 07/24/2020   History of bradycardia 05/16/2019   Lumbar disc disease 09/04/2018   Aortic atherosclerosis ([place]) 05/24/2018   Tubular adenoma 01/11/2017   Benign essential hypertension 03/31/2016   Hyperlipidemia, mixed 02/18/2016   Stage 3b chronic kidney disease ([place]) 03/23/2014   CAD (coronary artery disease) of bypass graft 12/23/2013   S/P CABG x 5 03/30/2012   S/P angioplasty with stent 03/24/2012   HTN (hypertension) 03/24/2012   GERD (gastroesophageal reflux disease) 03/24/2012   NSTEMI (non-ST elevated myocardial infarction) ([place]) 03/24/2012   AAA (abdominal aortic aneurysm) 03/24/2012    Past Medical History:  Diagnosis Date   Arthritis    Atrial fibrillation (HCC)    CHF (congestive heart failure) ([place])    Coronary artery disease    Diabetes mellitus without complication (HCC)    Dyspnea    GERD (gastroesophageal reflux disease)    Hyperlipidemia    Hypertension    Myocardial infarction ([place])    Peptic ulcer disease     Sleep apnea      Transfusion:    Consultants (if any):   Discharged Condition: Improved  Hospital Course: Jon Mcgrath is an 81 y.o. male who was admitted 10/27/2021 from the ED with a diagnosis of intractable pain and mild CHF exacerbation after undergoing R TKA on 10/25/21 and subsequent discharge on 10/26/21. Patient received appropriate analgesics and diuretics for mild CHF exacerbation.  The patient received DVT prophylaxis in the form of mobilization,  Eliquis, TED hose, and SCDs . A sacral pad had been placed and heels were elevated off of the bed with rolled towels in order to protect skin integrity. The surgical incision was healing well without signs of infection.  Physical therapy was resumed for transfers, gait training, and strengthening. Occupational therapy was initiated for activities of daily living and evaluation for assisted devices. The patient made steady progress with physical therapy and physical therapy recommended discharge to Home.   The patient achieved the preliminary goals of this hospitalization and was felt to be medically and orthopaedically appropriate for discharge.  He was given perioperative antibiotics:  Anti-infectives (From admission, onward)    None     .  Recent vital signs:  Vitals:   10/29/21 0434 10/29/21 0856  BP: 131/88 131/72  Pulse: 81 93  Resp: 18 18  Temp: 98 F (36.7 C) 98.4 F (36.9 C)  SpO2: 92% 92%    Recent laboratory studies:  Recent Labs    10/27/21 1646 10/27/21 1852  WBC 12.0* 11.4*  HGB 13.3 12.8*  HCT 41.4 39.4  PLT 210 205  K 3.8  --   CL 107  --   CO2 23  --  BUN 24*  --   CREATININE 1.22 1.09  GLUCOSE 129*  --   CALCIUM 8.5*  --     Diagnostic Studies: US Venous Img Lower Unilateral Right  Result Date: 10/27/2021 CLINICAL DATA:  Pain and swelling, shortness of breath EXAM: Right LOWER EXTREMITY VENOUS DOPPLER ULTRASOUND TECHNIQUE: Gray-scale sonography with compression, as well as color and  duplex ultrasound, were performed to evaluate the deep venous system(s) from the level of the common femoral vein through the popliteal and proximal calf veins. COMPARISON:  None. FINDINGS: VENOUS Normal compressibility of the common femoral, superficial femoral, and popliteal veins, as well as the visualized calf veins. Visualized portions of profunda femoral vein and great saphenous vein unremarkable. No filling defects to suggest DVT on grayscale or color Doppler imaging. Doppler waveforms show normal direction of venous flow, normal respiratory plasticity and response to augmentation. Limited views of the contralateral common femoral vein are unremarkable. OTHER None. Limitations: none IMPRESSION: There is no evidence of deep venous thrombosis in the right lower extremity. Electronically Signed   By: Elmer Picker M.D.   On: 10/27/2021 17:40   DG Chest Portable 1 View  Result Date: 10/27/2021 CLINICAL DATA:  Shortness of breath. EXAM: PORTABLE CHEST 1 VIEW COMPARISON:  04/23/2012 FINDINGS: Mildly enlarged cardiac silhouette with an interval increase in size. Mild increase in prominence of the interstitial markings with no Kerley lines. Normal vasculature. No pleural fluid. Stable post CABG changes. Lower thoracic spine degenerative changes. IMPRESSION: Interval mild cardiomegaly and mild chronic interstitial lung disease. Electronically Signed   By: Claudie Revering M.D.   On: 10/27/2021 17:05   DG Knee Complete 4 Views Right  Result Date: 10/27/2021 CLINICAL DATA:  Follow-up right knee replacement. EXAM: RIGHT KNEE - COMPLETE 4+ VIEW COMPARISON:  10/25/2021 FINDINGS: Stable right total knee prosthesis in satisfactory position and alignment. No fracture or dislocation. Stable overlying skin clips. The surgical drains have been removed. Vascular clips in the medial right lower leg are noted. There are also atheromatous arterial calcifications in the distal thigh. IMPRESSION: Stable right knee prosthesis  with no complicating features. Electronically Signed   By: Claudie Revering M.D.   On: 10/27/2021 17:03   DG Knee Right Port  Result Date: 10/25/2021 CLINICAL DATA:  Postoperative for right total knee replacement EXAM: PORTABLE RIGHT KNEE - 1-2 VIEW COMPARISON:  01/16/2016 and MRI from 07/28/2020 FINDINGS: A right total knee arthroplasty is in place without visible periprosthetic fracture or early complicating feature. Gas noted along the soft tissues and a drain is in place. IMPRESSION: 1. Right total knee prosthesis placement without early complicating feature identified. Electronically Signed   By: Van Clines M.D.   On: 10/25/2021 16:00    Discharge Medications:   Allergies as of 10/29/2021   No Known Allergies      Medication List     STOP taking these medications    aspirin EC 81 MG tablet       TAKE these medications    amLODipine 5 MG tablet Commonly known as: NORVASC Take 5 mg by mouth daily.   apixaban 5 MG Tabs tablet Commonly known as: ELIQUIS Take 5 mg by mouth 2 (two) times daily.   atorvastatin 10 MG tablet Commonly known as: LIPITOR Take 10 mg by mouth daily.   carboxymethylcellulose 0.5 % Soln Commonly known as: REFRESH PLUS 1 drop daily with breakfast.   celecoxib 200 MG capsule Commonly known as: CELEBREX Take 1 capsule (200 mg total) by mouth 2 (two)  times daily.   colchicine 0.6 MG tablet Take 0.6 mg by mouth 2 (two) times daily as needed.   methocarbamol 500 MG tablet Commonly known as: ROBAXIN Take 500 mg by mouth 2 (two) times daily as needed for muscle spasms.   metoprolol succinate 50 MG 24 hr tablet Commonly known as: TOPROL-XL Take by mouth daily. 50mg  in the morning and 25mg  in the evening   metoprolol tartrate 25 MG tablet Commonly known as: LOPRESSOR Take 25 mg by mouth every evening.   omeprazole 20 MG capsule Commonly known as: PRILOSEC Take 40 mg by mouth daily.   oxyCODONE 5 MG immediate release tablet Commonly known  as: Oxy IR/ROXICODONE Take 1 tablet (5 mg total) by mouth every 4 (four) hours as needed for moderate pain (pain score 4-6).   temazepam 30 MG capsule Commonly known as: RESTORIL Take 30 mg by mouth at bedtime.   traMADol 50 MG tablet Commonly known as: ULTRAM Take 50 mg by mouth every 6 (six) hours as needed for moderate pain.   traMADol 50 MG tablet Commonly known as: ULTRAM Take 1 tablet (50 mg total) by mouth every 4 (four) hours as needed for moderate pain.        Disposition: Home with home health PT     Follow-up Information     Fausto Skillern, PA-C Follow up on 11/09/2021.   Specialty: Orthopedic Surgery Why: 9:45 AM For staple removal Contact information: [employer] [id number] [phone]         Dereck Leep, MD Follow up on 12/07/2021.   Specialty: Orthopedic Surgery Why: 2:45 PM for follow-up Contact information: [place] [id number] [place], PA-C 10/29/2021, 1:04 PM

## 2021-10-29 NOTE — Progress Notes (Signed)
Occupational Therapy Treatment Patient Details Name: Jon Mcgrath MRN: [id number] DOB: [birth year] Today's Date: 10/29/2021   History of present illness 81 y.o. 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  PMH includes SOB with exertion, sleep apnea with CPAP, htn, pulmonary htn, CAD, h/o MI, CABG x5 2013, CHF, a-fib, DM. Pt d/c home on 10/26/21 and returned to ED 10/27/21 due to intractable pain, swelling and some shortness of breath.   OT comments  Pt seen for OT tx. Family present. Pt performed bed mobility with modified independence. Family reports having ordered a bed rail to assist with bed mobility at home as well as getting a bariatric BSC from another family member to set up at pt's home. Pt/family educated in AE/DME for ADL, importance of polar care use, importance of short bouts of activity throughout the day, and falls prevention. Pt completed toileting with bari BSC over toilet with supv for the transfer. Left with PTA for additional session. Pt progressing well towards goals, discharge rec updated to reflect progress.    Recommendations for follow up therapy are one component of a multi-disciplinary discharge planning process, led by the attending physician.  Recommendations may be updated based on patient status, additional functional criteria and insurance authorization.    Follow Up Recommendations  Home health OT    Assistance Recommended at Discharge Intermittent Supervision/Assistance  Patient can return home with the following  Assistance with cooking/housework;Help with stairs or ramp for entrance;A little help with walking and/or transfers;A little help with bathing/dressing/bathroom   Equipment Recommendations       Recommendations for Other Services      Precautions / Restrictions Precautions Precautions: Fall;Knee Precaution Booklet Issued: Yes (comment) Restrictions Weight Bearing Restrictions: Yes RLE Weight Bearing: Weight bearing as  tolerated       Mobility Bed Mobility Overal bed mobility: Modified Independent Bed Mobility: Supine to Sit     Supine to sit: Modified independent (Device/Increase time)     General bed mobility comments: increased time/effort, family reports ordering a bed rail and should arrive to home soon. Pt's bed at home can have [place] elevated    Transfers Overall transfer level: Needs assistance Equipment used: Rolling walker (2 wheels) Transfers: Sit to/from Stand Sit to Stand: Supervision           General transfer comment: 1 VC for hand placement and feet placement to improve technqiue     Balance Overall balance assessment: Needs assistance Sitting-balance support: No upper extremity supported, Feet supported Sitting balance-Leahy Scale: Normal     Standing balance support: Bilateral upper extremity supported Standing balance-Leahy Scale: Good                             ADL either performed or assessed with clinical judgement   ADL Overall ADL's : Needs assistance/impaired                                       General ADL Comments: CGA for toilet transfer with [facility] over toilet. Mod indep with hygiene using lateral lean. Family reports plan to get bariatric BSC from other familymember to set up at pt's home at discharge. Gait belt provided to family.    Extremity/Trunk Assessment              Vision       Perception  Praxis      Cognition Arousal/Alertness: Awake/alert Behavior During Therapy: WFL for tasks assessed/performed Overall Cognitive Status: Within Functional Limits for tasks assessed                                 General Comments: minimal fear of falling        Exercises Other Exercises Other Exercises: pt/family educated in polar care instruction, opportunities for activity pacing at home to support recovery while minimizing stiffness/pain in knee    Shoulder Instructions        General Comments Lengthy education with pt and daughter about DC recommendations. Both state understanding and state they are confident about DC home. MD and Pryor Curia discussed pt's case. He will be DC home this afternoon with HHPT to follow.    Pertinent Vitals/ Pain       Pain Assessment Pain Assessment: 0-10 Pain Score: 8  Pain Location: R knee Pain Descriptors / Indicators: Discomfort, Dull, Grimacing Pain Intervention(s): Limited activity within patient's tolerance, Monitored during session, Premedicated before session, Repositioned, Patient requesting pain meds-RN notified, RN gave pain meds during session  Home Living                                          Prior Functioning/Environment              Frequency  Min 2X/week        Progress Toward Goals  OT Goals(current goals can now be found in the care plan section)  Progress towards OT goals: Progressing toward goals  Acute Rehab OT Goals Patient Stated Goal: toimprove pain OT Goal Formulation: With patient/family Time For Goal Achievement: 11/11/21 Potential to Achieve Goals: Good  Plan Frequency remains appropriate;Discharge plan needs to be updated    Co-evaluation                 AM-PAC OT "6 Clicks" Daily Activity     Outcome Measure   Help from another person eating meals?: None Help from another person taking care of personal grooming?: None Help from another person toileting, which includes using toliet, bedpan, or urinal?: None Help from another person bathing (including washing, rinsing, drying)?: A Little Help from another person to put on and taking off regular upper body clothing?: None Help from another person to put on and taking off regular lower body clothing?: A Little 6 Click Score: 22    End of Session Equipment Utilized During Treatment: Rolling walker (2 wheels);Gait belt  OT Visit Diagnosis: Other abnormalities of gait and mobility (R26.89);Muscle weakness  (generalized) (M62.81)   Activity Tolerance Patient tolerated treatment well   Patient Left Other (comment) (standing with RW, PTA in for additional session)   Nurse Communication Mobility status        Time: 1610-9604 OT Time Calculation (min): 24 min  Charges: OT General Charges $OT Visit: 1 Visit OT Treatments $Self Care/Home Management : 23-37 mins  Ardeth Perfect., MPH, MS, OTR/L ascom [phone] 10/29/21, 1:18 PM

## 2021-10-29 NOTE — Progress Notes (Signed)
Blood pressure 131/72, pulse 93, temperature 98.4 F (36.9 C), resp. rate 18, height 5\' 9"  (1.753 m), weight 113.4 kg, SpO2 92 %. IV removed site c/d/I, pt went home with a new honeycomb dressing and two extra ones per MD orders, pt sent home with polar care and discharge packet. Discussed d/c packet with pt and daughter at bedside, both verbalized understanding, pt transported via w/c down to private car.

## 2021-10-29 NOTE — Progress Notes (Signed)
°  Subjective:   -intractable pain -s/p R TKA 10/25/21 Patient reports pain as well-controlled.   Patient is well, and has had no acute complaints or problems Plan is to go Home after hospital stay. Negative for chest pain and shortness of breath Fever: no Gastrointestinal: negative for nausea and vomiting.  Patient has had a bowel movement.  Objective: Vital signs in last 24 hours: Temp:  [97.6 F (36.4 C)-98.3 F (36.8 C)] 98 F (36.7 C) (01/27 0434) Pulse Rate:  [65-86] 81 (01/27 0434) Resp:  [17-18] 18 (01/27 0434) BP: (110-133)/(66-97) 131/88 (01/27 0434) SpO2:  [91 %-99 %] 92 % (01/27 0434)  Intake/Output from previous day:  Intake/Output Summary (Last 24 hours) at 10/29/2021 0754 Last data filed at 10/29/2021 0731 Gross per 24 hour  Intake 406.5 ml  Output 850 ml  Net -443.5 ml    Intake/Output this shift: Total I/O In: -  Out: 200 [Urine:200]  Labs: Recent Labs    10/27/21 1646 10/27/21 1852  HGB 13.3 12.8*   Recent Labs    10/27/21 1646 10/27/21 1852  WBC 12.0* 11.4*  RBC 4.68 4.43  HCT 41.4 39.4  PLT 210 205   Recent Labs    10/27/21 1646 10/27/21 1852  NA 139  --   K 3.8  --   CL 107  --   CO2 23  --   BUN 24*  --   CREATININE 1.22 1.09  GLUCOSE 129*  --   CALCIUM 8.5*  --    No results for input(s): LABPT, INR in the last 72 hours.   EXAM General - Patient is Alert, Appropriate, and Oriented Extremity - Neurovascular intact Dorsiflexion/Plantar flexion intact Compartment soft Dressing/Incision -Polar Care in place and working, mild dry drainage on dressing, unchanged from previous Motor Function - intact, moving foot and toes well on exam. Able to perform SLR with assistance.   Cardiovascular- Regular rate and rhythm, no murmurs/rubs/gallops Respiratory- Lungs clear to auscultation bilaterally Gastrointestinal- soft and active bowel sounds, minimally tender in the LUQ   Assessment/Plan:    Principal Problem:   Intractable  pain  Estimated body mass index is 36.92 kg/m as calculated from the following:   Height as of this encounter: 5\' 9"  (1.753 m).   Weight as of this encounter: 113.4 kg. Advance diet Up with therapy  Notes reviewed. Patient did well with therapy yesterday, unlikely to qualify for SNF placement.  Spoke with patient's daughter, she will be present around 10:30. Will coordinate this with PT so daughter is present and has any questions answered.  Anticipate discharge with [facility] therapy later today.      DVT Prophylaxis - Eliquis, Ted hose, and SCDs Weight-Bearing as tolerated to right leg  Cassell Smiles, PA-C [facility] Orthopaedic Surgery 10/29/2021, 7:54 AM

## 2021-10-29 NOTE — Discharge Instructions (Addendum)
°  Instructions after Total Knee Replacement ° ° James P. Hooten, Jr., M.D.    ° Dept. of Orthopaedics & Sports Medicine ° [facility] ° [address] ° [place], [place]  [address] ° Phone: [phone]   Fax: [phone] ° °  °DIET: °• Drink plenty of non-alcoholic fluids. °• Resume your normal diet. Include foods high in fiber. ° °ACTIVITY:  °• You may use crutches or a walker with weight-bearing as tolerated, unless instructed otherwise. °• You may be weaned off of the walker or crutches by your Physical Therapist.  °• Do NOT place pillows under the knee. Anything placed under the knee could limit your ability to straighten the knee.   °• Continue doing gentle exercises. Exercising will reduce the pain and swelling, increase motion, and prevent muscle weakness.   °• Please continue to use the TED compression stockings for 6 weeks. You may remove the stockings at night, but should reapply them in the morning. °• Do not drive or operate any equipment until instructed. ° °WOUND CARE:  °• Continue to use the PolarCare or ice packs periodically to reduce pain and swelling. °• You may bathe or shower after the staples are removed at the first office visit following surgery. ° °MEDICATIONS: °• You may resume your regular medications. °• Please take the pain medication as prescribed on the medication. °• Do not take pain medication on an empty stomach. °• You have been given a prescription for a blood thinner (Lovenox or Coumadin). Please take the medication as instructed. (NOTE: After completing a 2 week course of Lovenox, take one Enteric-coated aspirin once a day. This along with elevation will help reduce the possibility of phlebitis in your operated leg.) °• Do not drive or drink alcoholic beverages when taking pain medications. ° °CALL THE OFFICE FOR: °• Temperature above 101 degrees °• Excessive bleeding or drainage on the dressing. °• Excessive swelling, coldness, or paleness of the toes. °• Persistent  nausea and vomiting. ° °FOLLOW-UP:  °• You should have an appointment to return to the office in 10-14 days after surgery. °• Arrangements have been made for continuation of Physical Therapy (either home therapy or outpatient therapy). °  °

## 2021-11-24 ENCOUNTER — Other Ambulatory Visit
Admission: RE | Admit: 2021-11-24 | Discharge: 2021-11-24 | Disposition: A | Discharge status: Home / Self Care | Payer: Medicare Other | Source: Ambulatory Visit | Attending: Cardiology | Admitting: Cardiology

## 2021-11-24 DIAGNOSIS — I272 Pulmonary hypertension, unspecified: Secondary | ICD-10-CM | POA: Diagnosis present

## 2021-11-24 DIAGNOSIS — I482 Chronic atrial fibrillation, unspecified: Secondary | ICD-10-CM | POA: Insufficient documentation

## 2021-11-24 DIAGNOSIS — I25700 Atherosclerosis of coronary artery bypass graft(s), unspecified, with unstable angina pectoris: Secondary | ICD-10-CM | POA: Insufficient documentation

## 2021-11-24 DIAGNOSIS — I10 Essential (primary) hypertension: Secondary | ICD-10-CM | POA: Diagnosis present

## 2021-11-24 LAB — BRAIN NATRIURETIC PEPTIDE: B Natriuretic Peptide: 283.6 pg/mL — ABNORMAL HIGH (ref 0.0–100.0)

## 2021-12-01 ENCOUNTER — Other Ambulatory Visit: Payer: Self-pay

## 2021-12-01 ENCOUNTER — Other Ambulatory Visit: Payer: Self-pay | Admitting: Internal Medicine

## 2021-12-01 ENCOUNTER — Ambulatory Visit
Admission: RE | Admit: 2021-12-01 | Discharge: 2021-12-01 | Disposition: A | Discharge status: Home / Self Care | Payer: Medicare Other | Source: Ambulatory Visit | Attending: Internal Medicine | Admitting: Internal Medicine

## 2021-12-01 DIAGNOSIS — R600 Localized edema: Secondary | ICD-10-CM

## 2022-07-01 IMAGING — US US EXTREM LOW VENOUS*L*
1 series · 13 of 24 positions shown · non-contrast
Comparison: None.

CLINICAL DATA: Left lower extremity edema for 3 weeks



[Series 1: us extrem low venous*left* · 0.08mm/px · 13 of 34 slices shown]
[im 1/34]
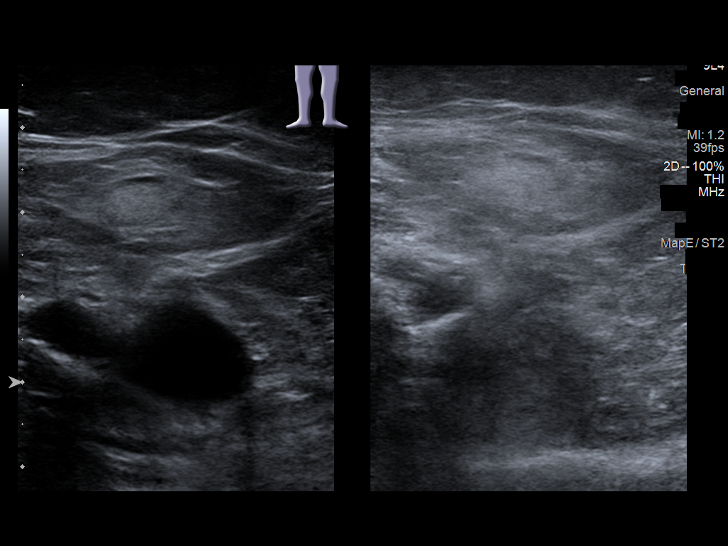
[im 3/34]
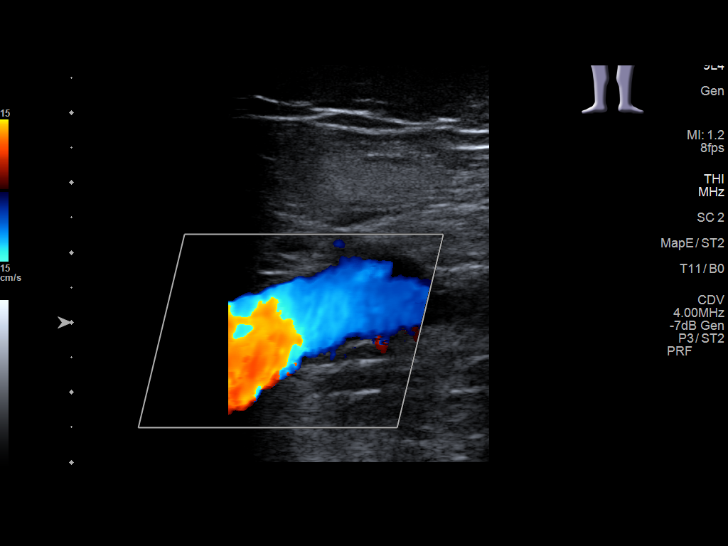
[im 6/34]
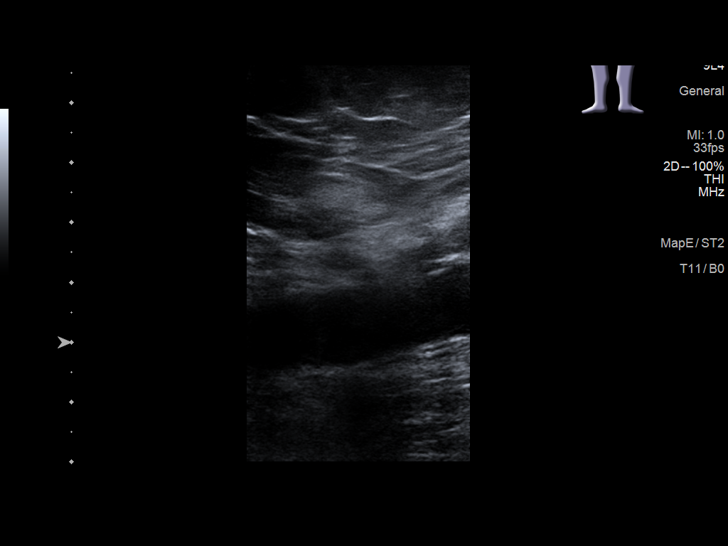
[im 9/34]
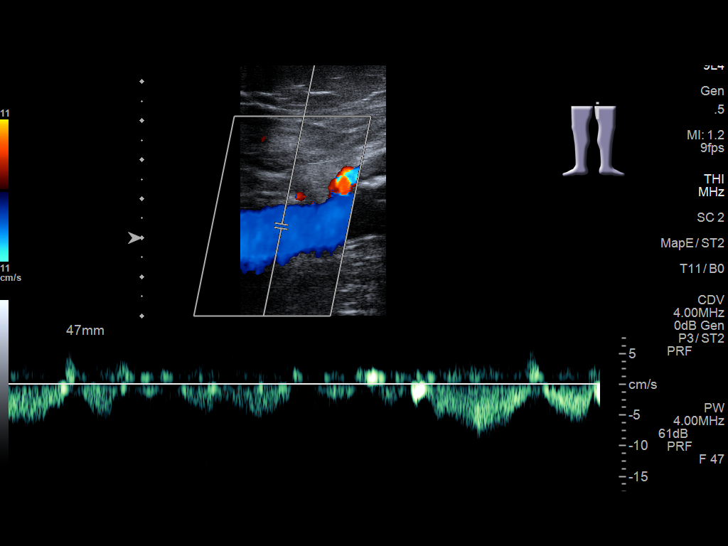
[im 12/34]
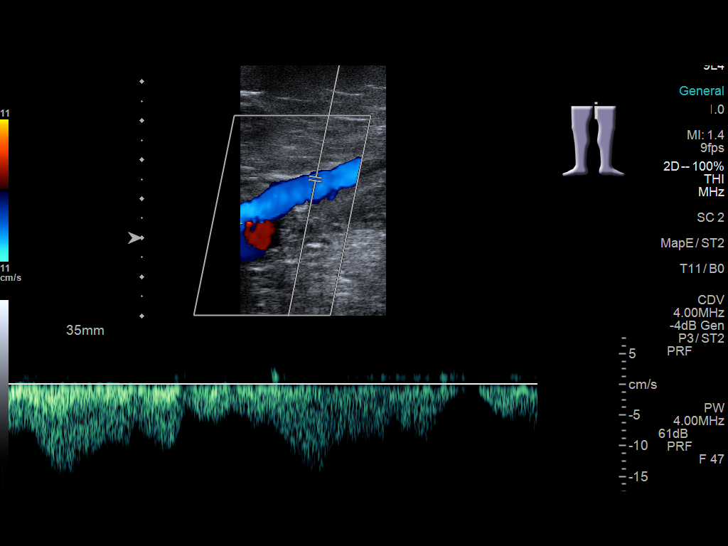
[im 15/34]
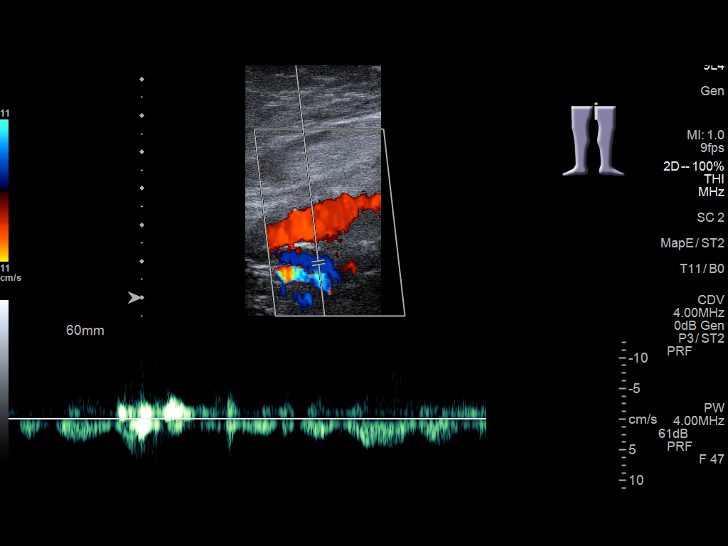
[im 18/34]
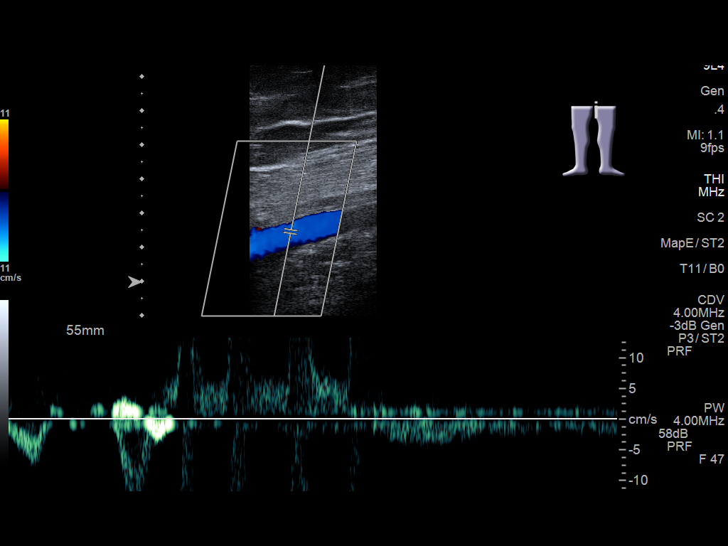
[im 19/34]
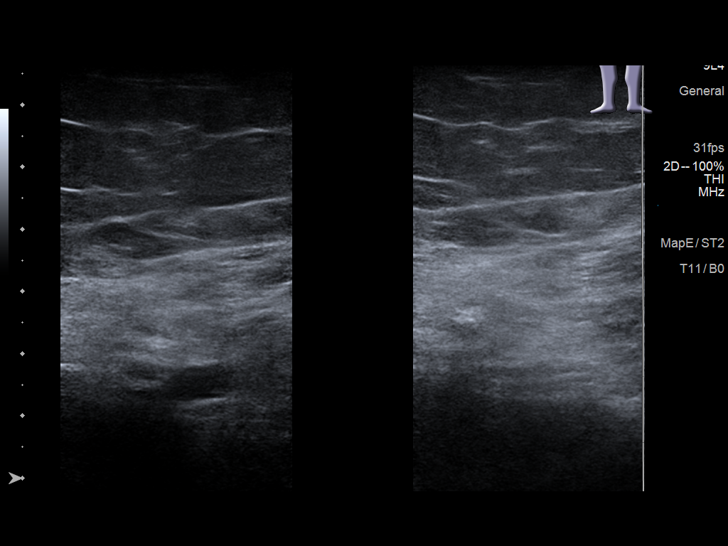
[im 22/34]
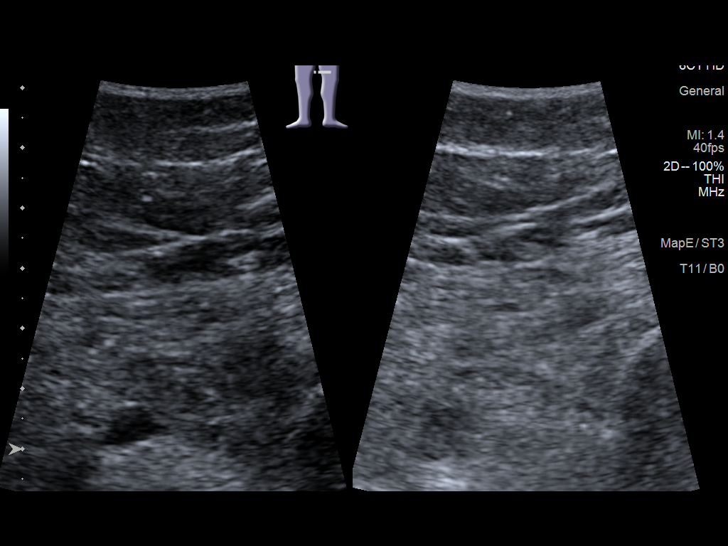
[im 25/34]
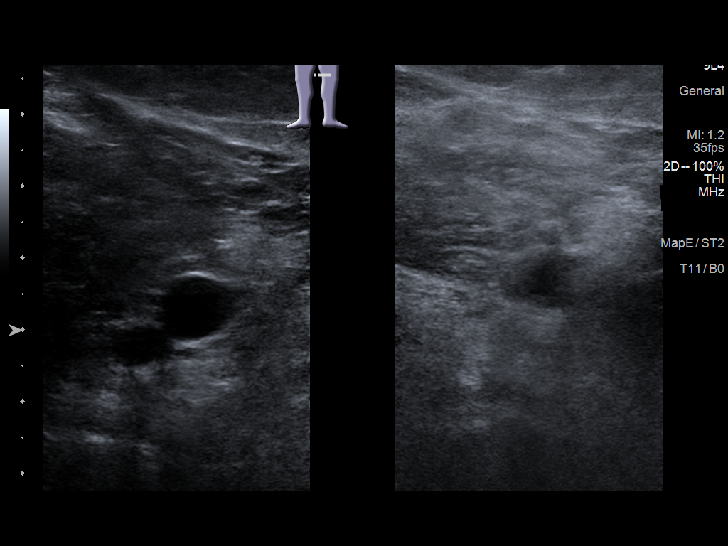
[im 28/34]
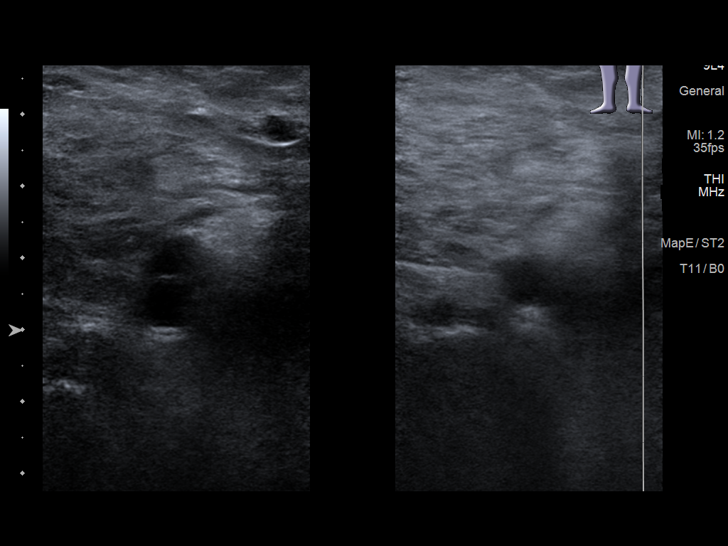
[im 31/34]
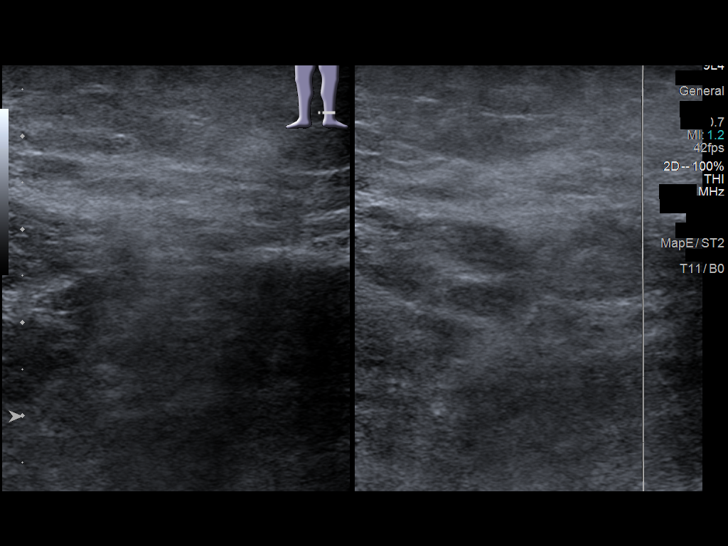
[im 34/34]
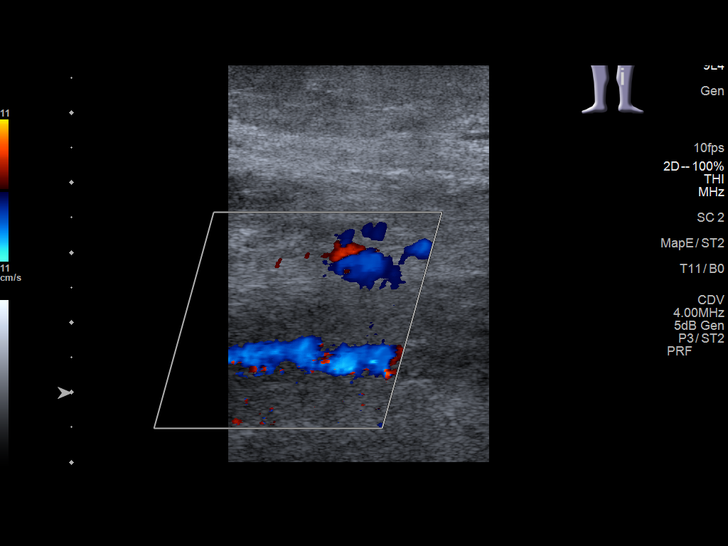

[13 of 24 positions shown; findings below may reference images not displayed]

FINDINGS: Contralateral Common Femoral Vein: Respiratory phasicity is normal
and symmetric with the symptomatic side. No evidence of thrombus.
Normal compressibility.

Common Femoral Vein: No evidence of thrombus. Normal
compressibility, respiratory phasicity and response to augmentation.

Saphenofemoral Junction: No evidence of thrombus. Normal
compressibility and flow on color Doppler imaging.

Profunda Femoral Vein: No evidence of thrombus. Normal
compressibility and flow on color Doppler imaging.

Femoral Vein: No evidence of thrombus. Normal compressibility,
respiratory phasicity and response to augmentation.

Popliteal Vein: No evidence of thrombus. Normal compressibility,
respiratory phasicity and response to augmentation.

Calf Veins: Limited calf vein visualization. Posterior tibial vein
appears patent and compressible. Peroneal vein not well
demonstrated.
IMPRESSION: No significant left lower extremity DVT. Limited assessment of the
calf veins.
